# Patient Record
Sex: Female | Born: 1945
Health system: Southern US, Community
[De-identification: ages and names within clinical notes are randomized; demographics above are authoritative.]

## PROBLEM LIST (undated history)

## (undated) DIAGNOSIS — E7800 Pure hypercholesterolemia, unspecified: Secondary | ICD-10-CM

## (undated) DIAGNOSIS — N189 Chronic kidney disease, unspecified: Secondary | ICD-10-CM

## (undated) DIAGNOSIS — I10 Essential (primary) hypertension: Secondary | ICD-10-CM

## (undated) DIAGNOSIS — M1990 Unspecified osteoarthritis, unspecified site: Secondary | ICD-10-CM

## (undated) HISTORY — PX: VASCULAR SURGERY: SHX849

---

## 1997-09-12 ENCOUNTER — Emergency Department (HOSPITAL_COMMUNITY): Admission: EM | Admit: 1997-09-12 | Discharge: 1997-09-12 | Payer: Self-pay | Admitting: Emergency Medicine

## 1999-02-25 ENCOUNTER — Encounter: Payer: Self-pay | Admitting: Emergency Medicine

## 1999-02-25 ENCOUNTER — Encounter: Payer: Self-pay | Admitting: Internal Medicine

## 1999-02-25 ENCOUNTER — Inpatient Hospital Stay (HOSPITAL_COMMUNITY): Admission: EM | Admit: 1999-02-25 | Discharge: 1999-02-26 | Payer: Self-pay | Admitting: Emergency Medicine

## 1999-03-23 ENCOUNTER — Emergency Department (HOSPITAL_COMMUNITY): Admission: EM | Admit: 1999-03-23 | Discharge: 1999-03-23 | Payer: Self-pay | Admitting: Emergency Medicine

## 2000-02-03 ENCOUNTER — Encounter: Admission: RE | Admit: 2000-02-03 | Discharge: 2000-02-03 | Payer: Self-pay | Admitting: Internal Medicine

## 2000-02-17 ENCOUNTER — Ambulatory Visit (HOSPITAL_COMMUNITY): Admission: RE | Admit: 2000-02-17 | Discharge: 2000-02-17 | Payer: Self-pay | Admitting: Internal Medicine

## 2000-02-17 ENCOUNTER — Encounter: Payer: Self-pay | Admitting: Internal Medicine

## 2000-05-03 ENCOUNTER — Emergency Department (HOSPITAL_COMMUNITY): Admission: EM | Admit: 2000-05-03 | Discharge: 2000-05-03 | Payer: Self-pay | Admitting: Emergency Medicine

## 2001-08-02 ENCOUNTER — Encounter: Admission: RE | Admit: 2001-08-02 | Discharge: 2001-08-02 | Payer: Self-pay | Admitting: Internal Medicine

## 2001-08-24 ENCOUNTER — Encounter: Admission: RE | Admit: 2001-08-24 | Discharge: 2001-08-24 | Payer: Self-pay | Admitting: Internal Medicine

## 2001-08-24 ENCOUNTER — Encounter: Payer: Self-pay | Admitting: Internal Medicine

## 2001-10-04 ENCOUNTER — Encounter: Payer: Self-pay | Admitting: Internal Medicine

## 2001-10-04 ENCOUNTER — Ambulatory Visit (HOSPITAL_COMMUNITY): Admission: RE | Admit: 2001-10-04 | Discharge: 2001-10-04 | Payer: Self-pay | Admitting: Internal Medicine

## 2001-10-11 ENCOUNTER — Encounter: Admission: RE | Admit: 2001-10-11 | Discharge: 2002-01-09 | Payer: Self-pay | Admitting: Internal Medicine

## 2003-01-14 ENCOUNTER — Emergency Department (HOSPITAL_COMMUNITY): Admission: EM | Admit: 2003-01-14 | Discharge: 2003-01-14 | Payer: Self-pay | Admitting: Emergency Medicine

## 2003-01-16 ENCOUNTER — Ambulatory Visit (HOSPITAL_COMMUNITY): Admission: RE | Admit: 2003-01-16 | Discharge: 2003-01-16 | Payer: Self-pay | Admitting: Internal Medicine

## 2005-03-18 ENCOUNTER — Emergency Department (HOSPITAL_COMMUNITY): Admission: EM | Admit: 2005-03-18 | Discharge: 2005-03-18 | Payer: Self-pay | Admitting: Emergency Medicine

## 2005-04-22 ENCOUNTER — Ambulatory Visit (HOSPITAL_COMMUNITY): Admission: RE | Admit: 2005-04-22 | Discharge: 2005-04-22 | Payer: Self-pay | Admitting: Internal Medicine

## 2006-07-02 ENCOUNTER — Ambulatory Visit (HOSPITAL_COMMUNITY): Admission: RE | Admit: 2006-07-02 | Discharge: 2006-07-02 | Payer: Self-pay | Admitting: Gastroenterology

## 2006-07-02 ENCOUNTER — Encounter (INDEPENDENT_AMBULATORY_CARE_PROVIDER_SITE_OTHER): Payer: Self-pay | Admitting: Specialist

## 2007-01-19 ENCOUNTER — Ambulatory Visit (HOSPITAL_COMMUNITY): Admission: RE | Admit: 2007-01-19 | Discharge: 2007-01-19 | Payer: Self-pay | Admitting: Family Medicine

## 2007-12-05 ENCOUNTER — Emergency Department (HOSPITAL_COMMUNITY): Admission: EM | Admit: 2007-12-05 | Discharge: 2007-12-05 | Payer: Self-pay | Admitting: Emergency Medicine

## 2007-12-15 LAB — CONVERTED CEMR LAB: Pap Smear: NORMAL

## 2008-02-24 ENCOUNTER — Ambulatory Visit (HOSPITAL_COMMUNITY): Admission: RE | Admit: 2008-02-24 | Discharge: 2008-02-24 | Payer: Self-pay | Admitting: Internal Medicine

## 2008-11-16 ENCOUNTER — Emergency Department (HOSPITAL_COMMUNITY): Admission: EM | Admit: 2008-11-16 | Discharge: 2008-11-16 | Payer: Self-pay | Admitting: Emergency Medicine

## 2009-01-24 ENCOUNTER — Ambulatory Visit: Payer: Self-pay | Admitting: Family Medicine

## 2009-01-24 ENCOUNTER — Encounter: Payer: Self-pay | Admitting: Family Medicine

## 2009-01-24 DIAGNOSIS — I10 Essential (primary) hypertension: Secondary | ICD-10-CM | POA: Insufficient documentation

## 2009-01-24 DIAGNOSIS — E559 Vitamin D deficiency, unspecified: Secondary | ICD-10-CM | POA: Insufficient documentation

## 2009-01-24 DIAGNOSIS — E785 Hyperlipidemia, unspecified: Secondary | ICD-10-CM | POA: Insufficient documentation

## 2009-01-24 LAB — CONVERTED CEMR LAB
AST: 12 units/L (ref 0–37)
Chloride: 104 meq/L (ref 96–112)
Cholesterol: 236 mg/dL — ABNORMAL HIGH (ref 0–200)
Creatinine, Ser: 1.08 mg/dL (ref 0.40–1.20)
HDL: 49 mg/dL (ref >39)
LDL Cholesterol: 169 mg/dL — ABNORMAL HIGH (ref 0–99)
Total Bilirubin: 0.6 mg/dL (ref 0.3–1.2)
Total Protein: 7.5 g/dL (ref 6.0–8.3)
Triglycerides: 90 mg/dL (ref <150)

## 2009-01-25 ENCOUNTER — Encounter: Payer: Self-pay | Admitting: Family Medicine

## 2009-02-22 ENCOUNTER — Ambulatory Visit: Payer: Self-pay | Admitting: Family Medicine

## 2009-02-25 ENCOUNTER — Ambulatory Visit (HOSPITAL_COMMUNITY): Admission: RE | Admit: 2009-02-25 | Discharge: 2009-02-25 | Payer: Self-pay | Admitting: Family Medicine

## 2009-04-09 ENCOUNTER — Ambulatory Visit: Payer: Self-pay | Admitting: Family Medicine

## 2009-04-09 DIAGNOSIS — K089 Disorder of teeth and supporting structures, unspecified: Secondary | ICD-10-CM | POA: Insufficient documentation

## 2009-06-06 ENCOUNTER — Encounter: Payer: Self-pay | Admitting: Family Medicine

## 2009-10-22 ENCOUNTER — Ambulatory Visit: Payer: Self-pay | Admitting: Family Medicine

## 2009-10-22 DIAGNOSIS — B356 Tinea cruris: Secondary | ICD-10-CM | POA: Insufficient documentation

## 2009-10-22 DIAGNOSIS — M722 Plantar fascial fibromatosis: Secondary | ICD-10-CM | POA: Insufficient documentation

## 2009-10-31 ENCOUNTER — Telehealth (INDEPENDENT_AMBULATORY_CARE_PROVIDER_SITE_OTHER): Payer: Self-pay | Admitting: *Deleted

## 2009-11-04 ENCOUNTER — Ambulatory Visit: Payer: Self-pay | Admitting: Family Medicine

## 2009-11-04 ENCOUNTER — Encounter: Payer: Self-pay | Admitting: Sports Medicine

## 2009-11-04 DIAGNOSIS — K047 Periapical abscess without sinus: Secondary | ICD-10-CM | POA: Insufficient documentation

## 2009-12-02 ENCOUNTER — Encounter: Payer: Self-pay | Admitting: Family Medicine

## 2010-01-06 ENCOUNTER — Encounter: Payer: Self-pay | Admitting: Family Medicine

## 2010-01-06 ENCOUNTER — Ambulatory Visit: Payer: Self-pay | Admitting: Family Medicine

## 2010-01-06 DIAGNOSIS — R21 Rash and other nonspecific skin eruption: Secondary | ICD-10-CM | POA: Insufficient documentation

## 2010-01-09 ENCOUNTER — Ambulatory Visit: Payer: Self-pay | Admitting: Family Medicine

## 2010-01-14 ENCOUNTER — Encounter: Payer: Self-pay | Admitting: Family Medicine

## 2010-03-31 ENCOUNTER — Telehealth: Payer: Self-pay | Admitting: Family Medicine

## 2010-04-01 ENCOUNTER — Ambulatory Visit: Admission: RE | Admit: 2010-04-01 | Discharge: 2010-04-01 | Payer: Self-pay | Source: Home / Self Care

## 2010-04-05 ENCOUNTER — Encounter: Payer: Self-pay | Admitting: Internal Medicine

## 2010-04-15 NOTE — Progress Notes (Signed)
Summary: Ref Req  Phone Note Call from Patient Call back at Home Phone [phone]   Caller: Patient Summary of Call: Would like to be referred to the Dental Clinic has a toothache now. Initial call taken by: Clydell Hakim,  October 31, 2009 2:03 PM  Follow-up for Phone Call        called and left message with person answering phone to have patient call tomorrow morning.   will need to make appointment to access tooh pain . If she requires antibiotic and pain med can send in urgent referral to Dental Clinic.   otherwise will not be able to send referral in until Septemebr since we have met our qouta of 10 referrals for August. Follow-up by: Theresia Lo RN,  October 31, 2009 5:11 PM  Additional Follow-up for Phone Call Additional follow up Details #1::        spoke with patient and she states she has been having problem for 2 weeks. tooth is sensitative to hot and cold , aches and may be swollen. offered appointment today for evaluation but she cannot come. appointment scheduled 11/04/2009. Additional Follow-up by: Theresia Lo RN,  November 01, 2009 9:14 AM

## 2010-04-15 NOTE — Assessment & Plan Note (Signed)
Summary: f/u bp/tcb   Vital Signs:  Patient profile:   65 year old female Height:      67.75 inches Weight:      219.8 pounds BMI:     33.79 Temp:     98.7 degrees F oral Pulse rate:   97 / minute BP sitting:   144 / 81  (left arm) Cuff size:   regular  Vitals Entered By: Gladstone Pih (April 09, 2009 11:24 AM)  Nutrition Counseling: Patient's BMI is greater than 25 and therefore counseled on weight management options. CC: F/U BP Is Patient Diabetic? No Pain Assessment Patient in pain? no        Primary Care Provider:  Lequita Asal  MD  CC:  F/U BP.  History of Present Illness: 65 y/o female here to f/u HTN  HTN- reports BPs remain  mostly in 140s/80s despite addition of metoprolol. last visit HCTZ increased to 25 mg daily. denies chest pain, SOB, palpitations. no headaches. no lightheadedness. on losartan and metoprolol as well.  dental pain- awaiting extractions. no fever or chills or facial swelling.   Habits & Providers  Alcohol-Tobacco-Diet     Tobacco Status: never  Current Medications (verified): 1)  Losartan Potassium 50 Mg Tabs (Losartan Potassium) .... One Tab By Mouth Daily 2)  Hydrochlorothiazide 25 Mg Tabs (Hydrochlorothiazide) .... One Tab By Mouth Daily 3)  Lopressor 50 Mg Tabs (Metoprolol Tartrate) .... One Half Tab By Mouth Bid 4)  Metoprolol Tartrate 50 Mg Tabs (Metoprolol Tartrate) .... One Tab By Mouth Two Times A Day For Blood Pressure  Allergies (verified): No Known Drug Allergies  Physical Exam  General:  well appearing female, NAD. vitals reviewed.  Mouth:  fair dentition, MMM. some dental pain in R mandible.  Neck:  no JVD, bruits, or thyromegaly Lungs:  Normal respiratory effort, chest expands symmetrically. Lungs are clear to auscultation, no crackles or wheezes. Heart:  Normal rate and regular rhythm. S1 and S2 normal without gallop, murmur, click, rub or other extra sounds. Pulses:  2+ distal pulses Extremities:  no  peripheral edema of BLE   Impression & Recommendations:  Problem # 1:  ESSENTIAL HYPERTENSION, BENIGN (ICD-401.1) Assessment Unchanged  increase metoprolol to 50 mg twice a day. follow up via phone in 1 month.   Her updated medication list for this problem includes:    Losartan Potassium 50 Mg Tabs (Losartan potassium) ..... One tab by mouth daily    Hydrochlorothiazide 25 Mg Tabs (Hydrochlorothiazide) ..... One tab by mouth daily    Lopressor 50 Mg Tabs (Metoprolol tartrate) ..... One half tab by mouth bid    Metoprolol Tartrate 50 Mg Tabs (Metoprolol tartrate) ..... One tab by mouth two times a day for blood pressure  Orders: [facility]- Est Level  3 ([address])  Problem # 2:  DENTAL PAIN (ICD-525.9) Assessment: New  ultram 50 mg by mouth q6 hours for pain. follow up with dentist  Orders: [facility]- Est Level  3 ([address])  Patient Instructions: 1)  Increase metoprolol to ONE TABLET twice a day.  2)  Check your blood pressure 2-3 times a week for 3-4 weeks, then CALL HERE with results.  3)  Follow up with Dr. Lanier Prude in June or July Prescriptions: ULTRAM 50 MG TABS (TRAMADOL HCL) one tab by mouth q6 hours as needed for pain  #60 x 1   Entered and Authorized by:   Lequita Asal  MD   Signed by:   Lequita Asal  MD on 04/09/2009  Method used:   Faxed to ...       [facility] Department (retail)       [address] [place], [place]  [address]       Ph: [phone]       Fax: [phone]   RxID:   [id number] METOPROLOL TARTRATE 50 MG TABS (METOPROLOL TARTRATE) one tab by mouth two times a day for BLOOD PRESSURE  #60 x 4   Entered and Authorized by:   Lequita Asal  MD   Signed by:   Lequita Asal  MD on 04/09/2009   Method used:   Faxed to ...       [facility] Department (retail)       [address] [place], [place]  [address]       Ph: [phone]       Fax: [phone]   RxID:   [id number]    Prevention & Chronic  Care Immunizations   Influenza vaccine: Fluvax Non-MCR  (01/24/2009)    Tetanus booster: 12/15/2007: given    Pneumococcal vaccine: Not documented    H. zoster vaccine: Not documented   H. zoster vaccine deferral: Not available  (01/24/2009)  Colorectal Screening   Hemoccult: Not documented   Hemoccult action/deferral: Not indicated  (01/24/2009)    Colonoscopy: done, normal  (12/15/2007)  Other Screening   Pap smear: normal  (12/15/2007)    Mammogram: ASSESSMENT: Negative - BI-RADS 1^MM DIGITAL SCREENING  (02/25/2009)    DXA bone density scan: Not documented   Smoking status: never  (04/09/2009)  Lipids   Total Cholesterol: 236  (01/24/2009)   LDL: 169  (01/24/2009)   LDL Direct: Not documented   HDL: 49  (01/24/2009)   Triglycerides: 90  (01/24/2009)    SGOT (AST): 12  (01/24/2009)   SGPT (ALT): 8  (01/24/2009)   Alkaline phosphatase: 75  (01/24/2009)   Total bilirubin: 0.6  (01/24/2009)  Hypertension   Last Blood Pressure: 144 / 81  (04/09/2009)   Serum creatinine: 1.08  (01/24/2009)   Serum potassium 4.1  (01/24/2009)    Hypertension flowsheet reviewed?: Yes   Progress toward BP goal: Improved  Self-Management Support :   Personal Goals (by the next clinic visit) :      Personal blood pressure goal: 140/90  (01/24/2009)     Personal LDL goal: 130  (01/24/2009)    Patient will work on the following items until the next clinic visit to reach self-care goals:     Medications and monitoring: check my blood pressure  (04/09/2009)     Eating: eat foods that are low in salt, eat baked foods instead of fried foods  (02/22/2009)     Activity: take a 30 minute walk every day, take the stairs instead of the elevator, park at the far end of the parking lot  (04/09/2009)    Hypertension self-management support: BP self-monitoring log, Written self-care plan, Education handout  (04/09/2009)   Hypertension self-care plan printed.   Hypertension education handout  printed    Lipid self-management support: Written self-care plan  (02/22/2009)

## 2010-04-15 NOTE — Miscellaneous (Signed)
Summary: fax refill  Clinical Lists Changes Refilled via fax request.   Medications: Rx of BENICAR HCT 40-25 MG TABS (OLMESARTAN MEDOXOMIL-HCTZ) one by mouth daily;  #30 x 12;  Signed;  Entered by: Doralee Albino MD;  Authorized by: Doralee Albino MD;  Method used: Handwritten    Prescriptions: BENICAR HCT 40-25 MG TABS (OLMESARTAN MEDOXOMIL-HCTZ) one by mouth daily  #30 x 12   Entered and Authorized by:   Doralee Albino MD   Signed by:   Doralee Albino MD on 01/14/2010   Method used:   Handwritten   RxID:   [id number]

## 2010-04-15 NOTE — Letter (Signed)
Summary: Handout Printed  Printed Handout:  - Abscessed Tooth 

## 2010-04-15 NOTE — Miscellaneous (Signed)
  Clinical Lists Changes  Medications: Removed medication of ULTRAM 50 MG TABS (TRAMADOL HCL) one tab by mouth q6 hours as needed for pain

## 2010-04-15 NOTE — Assessment & Plan Note (Signed)
Summary: RECHECK PER WORK IN DR/KH   Vital Signs:  Patient profile:   65 year old female Height:      67.75 inches Weight:      221 pounds BMI:     33.97 Temp:     98.8 degrees F oral Pulse rate:   76 / minute BP sitting:   146 / 76  (left arm) Cuff size:   regular  Vitals Entered By: Jimmy Footman, CMA (January 09, 2010 9:52 AM) CC: recheck rash on both arms Is Patient Diabetic? No Pain Assessment Patient in pain? no        Primary Care Provider:  Barnabas Lister  MD  CC:  recheck rash on both arms.  History of Present Illness: 65 yo female here for recheck of rash.  Improved.  No new lesions.  +pruritis.  Has not gotten cream for itching.  Taking abx without problems.    Habits & Providers  Alcohol-Tobacco-Diet     Tobacco Status: never  Allergies: No Known Drug Allergies  Review of Systems       see HPI  Physical Exam  General:  Well-developed,well-nourished,in no acute distress; alert,appropriate and cooperative throughout examination.  Vitals noted. Skin:  3 hyperpigmented lesions on forehead.  Scattered crusted lesions on B arms, mostly extensor surfaces, and few lesions on B hands.  No excoriations or tracks.  No surrounding erythema.   Impression & Recommendations:  Problem # 1:  SKIN RASH (ICD-782.1)  Unclear etiology.  Healing well.  May use cream from Dr. Jeanice Lim for pruritis, and will continue Bactrim to finish course.  Follow up as needed.  Orders: [facility]- Est Level  2 ([address])  Problem # 2:  Preventive Health Care (ICD-V70.0) Flu shot today.  Complete Medication List: 1)  Benicar Hct 40-25 Mg Tabs (Olmesartan medoxomil-hctz) .... One by mouth daily 2)  Lopressor 50 Mg Tabs (Metoprolol tartrate) .... One half tab by mouth bid 3)  Vicodin 5-500 Mg Tabs (Hydrocodone-acetaminophen) .... One tab by mouth q6h as needed for severe pain. 4)  Crestor 10 Mg Tabs (Rosuvastatin calcium) .... Take 2 tabs by mouth at bedtime 5)  Bactrim Ds 800-160 Mg Tabs  (Sulfamethoxazole-trimethoprim) .... 2 tabs by mouth two times a day x 10 days 6)  Diphenhydramine Hcl 25 Mg Caps (Diphenhydramine hcl) .Marland Kitchen.. 1 by mouth q 6hrs as needed itch   Orders Added: 1)  [facility]- Est Level  2 [[address]]  Appended Document: RECHECK PER WORK IN DR/KH   Influenza Vaccine    Vaccine Type: Fluvax 3+    Site: right deltoid    Mfr: GlaxoSmithKline    Dose: 0.5 ml    Route: IM    Given by: Garen Grams LPN    Exp. Date: 09/10/2010    Lot #: [id number]    VIS given: 10/08/09 version given January 09, 2010.  Flu Vaccine Consent Questions    Do you have a history of severe allergic reactions to this vaccine? no    Any prior history of allergic reactions to egg and/or gelatin? no    Do you have a sensitivity to the preservative Thimersol? no    Do you have a past history of Guillan-Barre Syndrome? no    Do you currently have an acute febrile illness? no    Have you ever had a severe reaction to latex? no    Vaccine information given and explained to patient? yes    Are you currently pregnant? no

## 2010-04-15 NOTE — Assessment & Plan Note (Signed)
Summary: toothache/ls   Vital Signs:  Patient profile:   65 year old female Height:      67.75 inches Weight:      226 pounds BMI:     34.74 Temp:     98.7 degrees F oral Pulse rate:   78 / minute BP sitting:   151 / 76  (left arm) Cuff size:   large  Vitals Entered By: Tessie Fass CMA (November 04, 2009 9:34 AM) CC: toothache Is Patient Diabetic? No Pain Assessment Patient in pain? yes     Location: toothache Intensity: 8   Primary Care Provider:  Barnabas Lister  MD  CC:  toothache.  History of Present Illness: 65 yo female with long hx tooth problems, here with 1 month history of severe pain in L maxillary molar.  Needs to see a dentist but needs referral.  Has had several other teeth extracted in the past.  No fevers/chills, no HA, no visual changes, no neck pain, no jaw pain, no nasal drainage, no sinus pressure.  No eye pain.  Tol by mouth well.  Habits & Providers  Alcohol-Tobacco-Diet     Tobacco Status: never  Current Medications (verified): 1)  Benicar Hct 40-25 Mg Tabs (Olmesartan Medoxomil-Hctz) .... One Tab By Mouth Daily 2)  Hydrochlorothiazide 25 Mg Tabs (Hydrochlorothiazide) .... One Tab By Mouth Daily 3)  Lopressor 50 Mg Tabs (Metoprolol Tartrate) .... One Half Tab By Mouth Bid 4)  Crestor 20 Mg Tabs (Rosuvastatin Calcium) .... One Tab By Mouth Qhs 5)  Ibuprofen 800 Mg Tabs (Ibuprofen) .... Take One Tablet By Mouth Q12 Hr As Needed As Needed For Pain 6)  Amoxicillin 500 Mg Caps (Amoxicillin) .... One Tab By Mouth Three Times A Day X 10d 7)  Vicodin 5-500 Mg Tabs (Hydrocodone-Acetaminophen) .... One Tab By Mouth Q6h As Needed For Severe Pain.  Allergies (verified): No Known Drug Allergies  Review of Systems       See HPI  Physical Exam  General:  Well-developed,well-nourished,in no acute distress; alert,appropriate and cooperative throughout examination Head:  Normocephalic and atraumatic without obvious abnormalities.  Eyes:  No corneal or  conjunctival inflammation noted. EOMI. Perrla. Ears:  External ear exam shows no significant lesions or deformities.  Otoscopic examination reveals clear canals, tympanic membranes are intact bilaterally without bulging, retraction, inflammation or discharge. Hearing is grossly normal bilaterally. Nose:  External nasal examination shows no deformity or inflammation. Nasal mucosa are pink and moist without lesions or exudates. Mouth:  L maxillary molar necrotic, nearly completely destroyed, multiple other teeth in various stages of disrepair.  No over purulence but very TTP.   Neck:  No deformities, masses, or tenderness noted. Cervical Nodes:  No lymphadenopathy noted   Impression & Recommendations:  Problem # 1:  ABSCESS, TOOTH (ICD-522.5) Assessment New Amox 500 three times a day x 10d. Vicodin as needed. Dental referral for extraction. Handouts given.  Orders: [facility]- Est Level  3 ([address]) Dental Referral (Dentist)  Complete Medication List: 1)  Benicar Hct 40-25 Mg Tabs (Olmesartan medoxomil-hctz) .... One tab by mouth daily 2)  Hydrochlorothiazide 25 Mg Tabs (Hydrochlorothiazide) .... One tab by mouth daily 3)  Lopressor 50 Mg Tabs (Metoprolol tartrate) .... One half tab by mouth bid 4)  Crestor 20 Mg Tabs (Rosuvastatin calcium) .... One tab by mouth qhs 5)  Ibuprofen 800 Mg Tabs (ibuprofen)  .... Take one tablet by mouth q12 hr as needed as needed for pain 6)  Amoxicillin 500 Mg Caps (Amoxicillin) .Marland KitchenMarland KitchenMarland Kitchen  One tab by mouth three times a day x 10d 7)  Vicodin 5-500 Mg Tabs (Hydrocodone-acetaminophen) .... One tab by mouth q6h as needed for severe pain. Prescriptions: VICODIN 5-500 MG TABS (HYDROCODONE-ACETAMINOPHEN) One tab by mouth q6h as needed for severe pain.  #30 x 0   Entered and Authorized by:   Rodney Langton MD   Signed by:   Rodney Langton MD on 11/04/2009   Method used:   Print then Give to Patient   RxID:   [id number] AMOXICILLIN 500 MG CAPS (AMOXICILLIN)  One tab by mouth three times a day x 10d  #30 x 0   Entered and Authorized by:   Rodney Langton MD   Signed by:   Rodney Langton MD on 11/04/2009   Method used:   Print then Give to Patient   RxID:   [id number]

## 2010-04-15 NOTE — Assessment & Plan Note (Signed)
Summary: rasgh all over,df   Vital Signs:  Patient profile:   65 year old female Height:      67.75 inches Weight:      221 pounds BMI:     33.97 Temp:     98.6 degrees F oral Pulse rate:   74 / minute BP sitting:   147 / 72  (left arm) Cuff size:   large  Vitals Entered By: Tessie Fass CMA (January 06, 2010 9:59 AM) CC: rash bilateral arm and face Pain Assessment Patient in pain? no        Primary Care Provider:  Barnabas Lister  MD  CC:  rash bilateral arm and face.  History of Present Illness:  Micah Flesher to the dentist last Wed, Had tooth pulled, numbing medicine used. Friday woke with rash on arms and face, large red bumps, some were draining, no fever, no vomiting no diarrhea, +pruritis no new medications, no new soaps lotion, detergents Tried a anti-itch cream over the counter Last antibiotics Amox x 10 days in Aug, with Vicodin   Needs something for pain for tooth  Current Medications (verified): 1)  Benicar Hct 40-25 Mg Tabs (Olmesartan Medoxomil-Hctz) .... One By Mouth Daily 2)  Lopressor 50 Mg Tabs (Metoprolol Tartrate) .... One Half Tab By Mouth Bid 3)  Vicodin 5-500 Mg Tabs (Hydrocodone-Acetaminophen) .... One Tab By Mouth Q6h As Needed For Severe Pain. 4)  Crestor 10 Mg Tabs (Rosuvastatin Calcium) .... Take 2 Tabs By Mouth At Bedtime 5)  Bactrim Ds 800-160 Mg Tabs (Sulfamethoxazole-Trimethoprim) .... 2 Tabs By Mouth Two Times A Day X 10 Days 6)  Diphenhydramine Hcl 25 Mg Caps (Diphenhydramine Hcl) .Marland Kitchen.. 1 By Mouth Q 6hrs As Needed Itch  Allergies (verified): No Known Drug Allergies  Past History:  Past Medical History: Last updated: 01/24/2009 Hyperlipidemia Hypertension Vitamin D Deficiency  Physical Exam  General:  Well-developed,well-nourished,in no acute distress; alert,appropriate and cooperative throughout examination Vital signs noted  Mouth:   multiple other teeth in various stages of disrepair.  swelling on lower gumline, brige in  place multiple cavities, no ulcerations noted   Neck:  No LAD Skin:  Mutipel vesicular lesion on forhead and bilateral arms, erythematous with clear drainage with palpation, some erythematous maculopapular lesions on fingers with excoriations, no pus expressed  Palms and soles spared, no lesions on back, chest, LE   Impression & Recommendations:  Problem # 1:  SKIN RASH (ICD-782.1) Assessment New  Unknown cause does not appear to be classisc allergic response to medications or contact dermaitis, some lesiosn similar to varicalla but not in classic presensation. With open drainaing lesions will cover with Bactrim for MRSA, given red flags recheck this week  Orders: [facility]- Est  Level 4 ([address])  Problem # 2:  ABSCESS, TOOTH (ICD-522.5) Assessment: Improved  s/p tooth removal, given course of vicodin as no meds prescribed by dentist  Orders: [facility]- Est  Level 4 ([address])  Complete Medication List: 1)  Benicar Hct 40-25 Mg Tabs (Olmesartan medoxomil-hctz) .... One by mouth daily 2)  Lopressor 50 Mg Tabs (Metoprolol tartrate) .... One half tab by mouth bid 3)  Vicodin 5-500 Mg Tabs (Hydrocodone-acetaminophen) .... One tab by mouth q6h as needed for severe pain. 4)  Crestor 10 Mg Tabs (Rosuvastatin calcium) .... Take 2 tabs by mouth at bedtime 5)  Bactrim Ds 800-160 Mg Tabs (Sulfamethoxazole-trimethoprim) .... 2 tabs by mouth two times a day x 10 days 6)  Diphenhydramine Hcl 25 Mg Caps (Diphenhydramine hcl) .Marland KitchenMarland KitchenMarland Kitchen  1 by mouth q 6hrs as needed itch  Patient Instructions: 1)  Take the antibiotics twice a day 2)  Use the vicodin as needed for pain 3)  Return for a visit on Thursday for a recheck by the work-in doctor Prescriptions: DIPHENHYDRAMINE HCL 25 MG CAPS (DIPHENHYDRAMINE HCL) 1 by mouth q 6hrs as needed itch  #60 x 1   Entered and Authorized by:   Milinda Antis MD   Signed by:   Milinda Antis MD on 01/06/2010   Method used:   Print then Give to Patient   RxID:    [id number] VICODIN 5-500 MG TABS (HYDROCODONE-ACETAMINOPHEN) One tab by mouth q6h as needed for severe pain.  #40 x 0   Entered and Authorized by:   Milinda Antis MD   Signed by:   Milinda Antis MD on 01/06/2010   Method used:   Handwritten   RxID:   [id number] BACTRIM DS 800-160 MG TABS (SULFAMETHOXAZOLE-TRIMETHOPRIM) 2 tabs by mouth two times a day x 10 days  #40 x 0   Entered and Authorized by:   Milinda Antis MD   Signed by:   Milinda Antis MD on 01/06/2010   Method used:   Print then Give to Patient   RxID:   [id number]    Orders Added: 1)  [facility]- Est  Level 4 [[address]]

## 2010-04-15 NOTE — Miscellaneous (Signed)
Summary: Change ARB  Clinical Lists Changes Based on drug availability through [facility], changed ARB. Medications: Changed medication from BENICAR HCT 40-25 MG TABS (OLMESARTAN MEDOXOMIL-HCTZ) one tab by mouth daily to AVAPRO 150 MG TABS (IRBESARTAN) one by mouth daily - Signed Rx of AVAPRO 150 MG TABS (IRBESARTAN) one by mouth daily;  #30 x 12;  Signed;  Entered by: Doralee Albino MD;  Authorized by: Doralee Albino MD;  Method used: Printed then faxed to [facility], [address] [place], [place], [place]  [address], Ph: [phone], Fax: [phone]    Prescriptions: AVAPRO 150 MG TABS (IRBESARTAN) one by mouth daily  #30 x 12   Entered and Authorized by:   Doralee Albino MD   Signed by:   Doralee Albino MD on 12/02/2009   Method used:   Printed then faxed to ...       [facility] Department (retail)       [address] [place], [place]  [address]       Ph: [phone]       Fax: [phone]   RxID:   [id number]

## 2010-04-15 NOTE — Assessment & Plan Note (Signed)
Summary: f/up,tcb   Vital Signs:  Patient profile:   65 year old female Weight:      232.2 pounds Temp:     99 degrees F oral Pulse rate:   71 / minute Pulse rhythm:   regular BP sitting:   133 / 80  (left arm) Cuff size:   large  Vitals Entered By: Loralee Pacas CMA (October 22, 2009 2:53 PM) CC: follow-up visit Is Patient Diabetic? No Comments right heel of foot hurting    Primary Provider:  Barnabas Lister  MD  CC:  follow-up visit.  History of Present Illness: CC: 1) Right heel pain, 2) Rash Right groin, 3) Chronic dental pain  HPI:  This is a 65 yo female with a PMH HTN, HLD who p/w Right heel pain and a rash on her groin.  The heel pain began one month ago.  This has never occurred before.  Pain is worse in the morning, most tender when she takes her first steps.  Dull pain that gets better with walking.  Denies any injury or trauma to foot.  Non-radiating pain.   Rash on groin has been present for years, but gets worse in the summer time when she gets over-heated and sweaty.  Patient wants to know what she can put on it to relieve the itching.  Uses Baby Oil but the sypmtoms come back.   Finally, patient c/o chronic dental pain.  Trying to get an appt with her dentist for multiple tooth extractions.  Wants something for pain until she can see her dentist.   ROS:  Pt denies any CP, SOB, N/V.  Denies fever, chills, HA.  Denies any numbness or tingling or decreased ROM in all extremities.  Does c/o right heel pain.  Problems Prior to Update: 1)  Tinea Cruris  (ICD-110.3) 2)  Dental Pain  (ICD-525.9) 3)  Vitamin D Deficiency  (ICD-268.9) 4)  Hyperlipidemia  (ICD-272.4) 5)  Essential Hypertension, Benign  (ICD-401.1)  Current Medications (verified): 1)  Benicar Hct 40-25 Mg Tabs (Olmesartan Medoxomil-Hctz) .... One Tab By Mouth Daily 2)  Hydrochlorothiazide 25 Mg Tabs (Hydrochlorothiazide) .... One Tab By Mouth Daily 3)  Lopressor 50 Mg Tabs (Metoprolol Tartrate) .... One  Half Tab By Mouth Bid 4)  Crestor 20 Mg Tabs (Rosuvastatin Calcium) .... One Tab By Mouth Qhs 5)  Ibuprofen 800 Mg Tabs (Ibuprofen) .... Take One Tablet By Mouth Q12 Hr As Needed As Needed For Pain  Allergies (verified): No Known Drug Allergies  Past History:  Past Medical History: Last updated: 01/24/2009 Hyperlipidemia Hypertension Vitamin D Deficiency  Past Surgical History: Last updated: 01/24/2009 BTL  Family History: Last updated: 10/22/2009 father- heart attack at 42, HLD Uncle- colon cancer DM - mother, grandparents HTN - all family members  Social History: Last updated: 01/24/2009 widowed. daughter, Misty Stanley, lives with her. enjoys going for walks and church. watches her great-granddaughter some times. denies EtOH, drugs, tobacco  Family History: father- heart attack at 20, HLD Uncle- colon cancer DM - mother, grandparents HTN - all family members  Review of Systems       per HPI  Physical Exam  General:  alert and cooperative to examination.   Mouth:  poor dentition and teeth missing.   Lungs:   Lungs are clear to auscultation, no crackles or wheezes. Heart:  Normal rate and regular rhythm. S1 and S2 normal without gallop, murmur, click, rub or other extra sounds. Msk:  Right and Left LE: normal ROM,  no joint tenderness, no joint swelling, no joint warmth, no redness over joints, no joint deformities, no joint instability, and no muscle atrophy.   Pulses:  R dorsalis pedis normal and L dorsalis pedis normal.     Impression & Recommendations:  Problem # 1:  FASCIITIS, PLANTAR (ICD-728.71) Assessment New Patient's heel pain most likely due to plantar fasciitis.  Pain is worse in the am and gets better with walking throughout the day.  Patient admits to wearing slippers around the house and sandals with no support.  Patient is overweight.  Advised patient to rest and to use ice on her heel three times a day for the next 2-4 weeks.  If pain persists,  recommended that she take one tablet of Ibuprofen 800 by mouth as needed for pain.  Gave patient handout on plantar fasciitis that includes descriptions of disease, treatment, and home exercises that can be used to alleviate the pain.  Told patient the pain usually subsides in 4 weeks, but to return to clinic if heel becomes red, swollen, or she can no longer bear weight on her feet.  Also, advised patient to wear heel inserts or sneakers for better support.  Problem # 2:  TINEA CRURIS (ICD-110.3) Assessment: Improved Patient c/o rash on her groin that has been present for years, but gets worse in the heat.  KOH prep was Positive.  Discussed with patient that she has a fungal infection that can be treated with OTC miconazole topical cream.  Advised to apply ointment to affected area twice a day.  Patient will f/u with me in 6 weeks for routine follow up and f/u on fungal infection.  Orders: [facility] (62831)  Problem # 3:  DENTAL PAIN (ICD-525.9) Assessment: Unchanged Her dental pain is chronic.  She needs to see her dentist to have multiple teeth extracted, but claims that she cannot make an appointment until September.  C/o Left sided pain.  No swelling, no draining, no erythema.  Advised patient to take OTC NSAIDS for pain relief.  If pain persists, she can take one tablet of Ibuprofen 800mg  by mouth as needed for pain.  Advised to please f/u with dentist for further evaluation.  Problem # 4:  ESSENTIAL HYPERTENSION, BENIGN (ICD-401.1) Assessment: Unchanged Patient BP was 133/80 today, which has improved from last visit 144/98.  BP at goal.  Did not need any refills at this time. Will f/u with patient and review preventative care measures at next visit.  The following medications were removed from the medication list:    Toprol Xl 100 Mg Xr24h-tab (Metoprolol succinate) ..... One tab by mouth daily Her updated medication list for this problem includes:    Benicar Hct 40-25 Mg Tabs (Olmesartan  medoxomil-hctz) ..... One tab by mouth daily    Hydrochlorothiazide 25 Mg Tabs (Hydrochlorothiazide) ..... One tab by mouth daily    Lopressor 50 Mg Tabs (Metoprolol tartrate) ..... One half tab by mouth bid  Complete Medication List: 1)  Benicar Hct 40-25 Mg Tabs (Olmesartan medoxomil-hctz) .... One tab by mouth daily 2)  Hydrochlorothiazide 25 Mg Tabs (Hydrochlorothiazide) .... One tab by mouth daily 3)  Lopressor 50 Mg Tabs (Metoprolol tartrate) .... One half tab by mouth bid 4)  Crestor 20 Mg Tabs (Rosuvastatin calcium) .... One tab by mouth qhs 5)  Ibuprofen 800 Mg Tabs (ibuprofen)  .... Take one tablet by mouth q12 hr as needed as needed for pain  Patient Instructions: 1)  For heel pain, try to rest and use  ice three times a day to relieve the pain.   2)  If heel and dental pain persist, take Ibuprofen 600mg  every 12 hr for pain. 3)  Please return to clinic if heel pain becomes worse, red, swollen, or you can no longer bear weight. 4)  For rash, please ask your local pharmacy for Miconazole topical cream.  Apply ointment to affected area twice a day. 5)  Schedule f/u appt with Dr. Tye Savoy in 6 months for routine check up. Prescriptions: IBUPROFEN 600 MG TABS (IBUPROFEN) Take one tablet by mouth q12 hr as needed as needed for pain  #60 x 0   Entered and Authorized by:   Ivy de Lawson Radar  MD   Signed by:   Barnabas Lister  MD on 10/22/2009   Method used:   Faxed to ...       [facility] (retail)       [address] [place], [place]  [address]       Ph: [phone]       Fax: [phone]   RxID:   [id number]   Laboratory Results  Date/Time Received: October 22, 2009 3:40 PM  Date/Time Reported: October 22, 2009 4:29 PM   Other Tests  Skin KOH: Positive Comments: ...............test performed by......Marland KitchenBonnie A. [place], MLS (ASCP)cm     Appended Document: f/up,tcb    Clinical Lists Changes  Orders: Added new Test order of [facility]- New  Level 3 (16073) - Signed      Appended Document: f/up,tcb    Clinical Lists Changes  Orders: Added new Test order of [facility]- Est Level  3 (71062) - Signed

## 2010-04-15 NOTE — Miscellaneous (Signed)
Summary: formulary change  Clinical Lists Changes MAP received meds so change back to benicar HCT Medications: Removed medication of HYDROCHLOROTHIAZIDE 25 MG TABS (HYDROCHLOROTHIAZIDE) one tab by mouth daily Changed medication from AVAPRO 150 MG TABS (IRBESARTAN) one by mouth daily to BENICAR HCT 40-25 MG TABS (OLMESARTAN MEDOXOMIL-HCTZ) one by mouth daily

## 2010-04-17 NOTE — Assessment & Plan Note (Signed)
Summary: dental pain-see notes/[place]   Vital Signs:  Patient profile:   65 year old female Height:      67.75 inches Weight:      225 pounds BMI:     34.59 Temp:     98.6 degrees F oral Pulse rate:   83 / minute BP sitting:   113 / 76  (left arm) Cuff size:   large  Vitals Entered By: Tessie Fass CMA (April 01, 2010 8:44 AM) CC: dental pain x 3 days Pain Assessment Patient in pain? yes     Location: dental Intensity: 8   Primary Care Provider:  Barnabas Lister  MD  CC:  dental pain x 3 days.  History of Present Illness: 1) Dental pain: x 3 days. Longstanding hsitory of dental issues. Pain at upper right lateral incisor. HAs already been referred to dentist for extractions - has had multiple extractions aleardy.   ROS: Denies fevers/chills, no HA, no visual changes, no neck pain,  no nasal drainage, no sinus pressure.  Able to eat and drink.   Allergies: No Known Drug Allergies  Physical Exam  General:  obese, NAD  Nose:  External nasal examination shows no deformity or inflammation. Nasal mucosa are pink and moist without lesions or exudates. Mouth:  multiple other teeth in various stages of disrepair.  mild swelling and erythema at upper gumline, multiple cavities, no abscess noted     Impression & Recommendations:  Problem # 1:  DENTAL PAIN (ICD-525.9) Assessment Deteriorated  Will treat for tooth abscess (though no visible abscess today). Advised follow up ASAP with dentist - she states she will schedule appointment today.   Orders: [facility]- Est Level  3 (11914)  Complete Medication List: 1)  Benicar Hct 40-25 Mg Tabs (Olmesartan medoxomil-hctz) .... One by mouth daily 2)  Lopressor 50 Mg Tabs (Metoprolol tartrate) .... One half tab by mouth bid 3)  Vicodin 5-500 Mg Tabs (Hydrocodone-acetaminophen) .... One tab by mouth q6h as needed for severe pain. 4)  Crestor 10 Mg Tabs (Rosuvastatin calcium) .... Take 2 tabs by mouth at bedtime 5)   Diphenhydramine Hcl 25 Mg Caps (Diphenhydramine hcl) .Marland Kitchen.. 1 by mouth q 6hrs as needed itch 6)  Penicillin V Potassium 500 Mg Tabs (Penicillin v potassium) .... One tab by mouth three times a day x 7 days  Patient Instructions: 1)  Make your appointment today with your dentist  2)  Follow up as needed  Prescriptions: VICODIN 5-500 MG TABS (HYDROCODONE-ACETAMINOPHEN) One tab by mouth q6h as needed for severe pain.  #20 x 0   Entered and Authorized by:   Bobby Rumpf  MD   Signed by:   Bobby Rumpf  MD on 04/01/2010   Method used:   Print then Give to Patient   RxID:   [id number] PENICILLIN V POTASSIUM 500 MG TABS (PENICILLIN V POTASSIUM) one tab by mouth three times a day x 7 days  #21 x 0   Entered and Authorized by:   Bobby Rumpf  MD   Signed by:   Bobby Rumpf  MD on 04/01/2010   Method used:   Print then Give to Patient   RxID:   [id number] VICODIN 5-500 MG TABS (HYDROCODONE-ACETAMINOPHEN) One tab by mouth q6h as needed for severe pain.  #40 x 0   Entered and Authorized by:   Bobby Rumpf  MD   Signed by:   Bobby Rumpf  MD on 04/01/2010   Method used:  Print then Give to Patient   RxID:   [id number] PENICILLIN V POTASSIUM 500 MG TABS (PENICILLIN V POTASSIUM) one tab by mouth three times a day x 7 days  #21 x 0   Entered and Authorized by:   Bobby Rumpf  MD   Signed by:   Bobby Rumpf  MD on 04/01/2010   Method used:   Print then Give to Patient   RxID:   [id number]    Orders Added: 1)  [facility]- Est Level  3 [16073]

## 2010-04-17 NOTE — Progress Notes (Signed)
Summary: phn msg  Phone Note Call from Patient Call back at Home Phone [phone]   Caller: Patient Summary of Call: is having teeth pain and wants something called in for this  Initial call taken by: De Nurse,  March 31, 2010 10:35 AM  Follow-up for Phone Call        pain in front upper teeth.  dentist told her to take tylenol. states thi sdoes not help. told her she will need to be seen if she wants something else. appt made for tomorrow am. states she cannot come in today.  relates that she is in the process of getting all of her teeth pulled. Follow-up by: Golden Circle RN,  March 31, 2010 10:47 AM

## 2010-06-13 ENCOUNTER — Emergency Department (HOSPITAL_COMMUNITY)
Admission: EM | Admit: 2010-06-13 | Discharge: 2010-06-13 | Disposition: A | Discharge status: Home / Self Care | Payer: Self-pay | Attending: Emergency Medicine | Admitting: Emergency Medicine

## 2010-06-13 DIAGNOSIS — R599 Enlarged lymph nodes, unspecified: Secondary | ICD-10-CM | POA: Insufficient documentation

## 2010-06-13 DIAGNOSIS — K047 Periapical abscess without sinus: Secondary | ICD-10-CM | POA: Insufficient documentation

## 2010-06-13 DIAGNOSIS — K029 Dental caries, unspecified: Secondary | ICD-10-CM | POA: Insufficient documentation

## 2010-06-13 DIAGNOSIS — K089 Disorder of teeth and supporting structures, unspecified: Secondary | ICD-10-CM | POA: Insufficient documentation

## 2010-06-13 DIAGNOSIS — E7800 Pure hypercholesterolemia, unspecified: Secondary | ICD-10-CM | POA: Insufficient documentation

## 2010-06-13 DIAGNOSIS — I10 Essential (primary) hypertension: Secondary | ICD-10-CM | POA: Insufficient documentation

## 2010-08-01 NOTE — Op Note (Signed)
Tara Guerra, PAGET                ACCOUNT NO.:  [id number]   MEDICAL RECORD NO.:  [id number]          PATIENT TYPE:  AMB   LOCATION:  ENDO                         FACILITY:  [facility]   PHYSICIAN:  Anselmo Rod, M.D.  DATE OF BIRTH:  1945-04-27   DATE OF PROCEDURE:  07/02/2006  DATE OF DISCHARGE:                               OPERATIVE REPORT   PROCEDURE PERFORMED:  Colonoscopy, with snare polypectomy x2, and cold  biopsies x1.   ENDOSCOPIST:  Anselmo Rod, M.D.   INSTRUMENT USED:  Pentax video colonoscope.   INDICATIONS FOR PROCEDURE:  A 65 year old African American female  undergoing a screening colonoscopy to rule out colonic polyps, masses,  etc.  The patient tells me today that she has found out her paternal  grandfather and her paternal uncle had colon cancer.  The exact nature  of the diagnosis is not known.   PREPROCEDURE PREPARATION:  Informed consent was procured from the  patient.  The patient was fasted for four hours prior to the procedure  and prepped with 20 OsmoPrep pills the night prior to the procedure and  12 OsmoPrep pills the morning of the procedure. Risks and benefits of  the procedure, including a 10% miss rate of cancer and polyps were  discussed with the patient as well.   PREPROCEDURE PHYSICAL EXAMINATION:  VITAL SIGNS:  The patient had stable  vital signs.  NECK:  Supple.  CHEST:  Clear to auscultation.  CARDIOVASCULAR:  S1, S2 regular.  ABDOMEN:  Soft, with normal bowel sounds.   DESCRIPTION OF THE PROCEDURE:  The patient was placed in the left  lateral decubitus position and sedated with 100 mcg of fentanyl and 10  mg of Versed given intravenously in slow incremental doses.  Once the  patient was adequately sedated and maintained on low-flow oxygen and  continuous cardiac monitoring, the Pentax video colonoscope was advanced  from the rectum to the cecum.  Two small sessile polyps was snared from  the right colon and from the hepatic  flexure, respectively.  These were  removed by a hot snare.  Another small sessile polyp was removed by cold  biopsy from the distal right colon.  Small internal hemorrhoids were  seen.  There was no evidence of diverticulosis.  The patient tolerated  the procedure well, without complication.  There was some residual stool  in the right colon.  Multiple washes were done.  Visualization was  adequate. The terminal ileum appeared healthy and without lesion.   IMPRESSION:  1. Small nonbleeding internal hemorrhoids.  2. Small sessile polyp biopsied from the distal right colon.  One      polyp snared from hepatic flexure, and one from the right colon      (hot snare x2).  3. Normal terminal ileum.  4. Some residual stool in the right colon.  Multiple washes done.  5. No evidence of diverticulosis.   RECOMMENDATIONS:  1. Await pathology results.  2. Avoid all nonsteroidals, including aspirin, for the next 2 weeks.  3. Repeat colonoscopy depending on pathology results.  4. Outpatient followup as  need arises in the future.      Anselmo Rod, M.D.  Electronically Signed     JNM/MEDQ  D:  07/02/2006  T:  07/02/2006  Job:  [id number]   cc:   Candyce Churn. Allyne Gee, M.D.

## 2010-12-15 LAB — POCT I-STAT, CHEM 8
BUN: 24 — ABNORMAL HIGH
Calcium, Ion: 1.2
Glucose, Bld: 97
Sodium: 137
TCO2: 29

## 2010-12-15 LAB — POCT CARDIAC MARKERS
Myoglobin, poc: 75.4
Troponin i, poc: <0.05

## 2011-02-09 ENCOUNTER — Other Ambulatory Visit (HOSPITAL_COMMUNITY): Payer: Self-pay | Admitting: Internal Medicine

## 2011-02-09 DIAGNOSIS — Z1231 Encounter for screening mammogram for malignant neoplasm of breast: Secondary | ICD-10-CM

## 2011-02-11 ENCOUNTER — Ambulatory Visit (HOSPITAL_COMMUNITY)
Admission: RE | Admit: 2011-02-11 | Discharge: 2011-02-11 | Disposition: A | Discharge status: Home / Self Care | Payer: Medicare Other | Source: Ambulatory Visit | Attending: Internal Medicine | Admitting: Internal Medicine

## 2011-02-11 DIAGNOSIS — Z1231 Encounter for screening mammogram for malignant neoplasm of breast: Secondary | ICD-10-CM | POA: Insufficient documentation

## 2011-06-08 ENCOUNTER — Other Ambulatory Visit: Payer: Self-pay | Admitting: Family Medicine

## 2013-01-03 ENCOUNTER — Other Ambulatory Visit (HOSPITAL_COMMUNITY): Payer: Self-pay | Admitting: Internal Medicine

## 2013-01-03 DIAGNOSIS — Z1231 Encounter for screening mammogram for malignant neoplasm of breast: Secondary | ICD-10-CM

## 2013-01-12 ENCOUNTER — Ambulatory Visit (HOSPITAL_COMMUNITY)
Admission: RE | Admit: 2013-01-12 | Discharge: 2013-01-12 | Disposition: A | Discharge status: Home / Self Care | Payer: Medicare Other | Source: Ambulatory Visit | Attending: Internal Medicine | Admitting: Internal Medicine

## 2013-01-12 DIAGNOSIS — Z1231 Encounter for screening mammogram for malignant neoplasm of breast: Secondary | ICD-10-CM | POA: Insufficient documentation

## 2013-02-25 ENCOUNTER — Emergency Department (HOSPITAL_COMMUNITY)
Admission: EM | Admit: 2013-02-25 | Discharge: 2013-02-25 | Disposition: A | Discharge status: Home / Self Care | Payer: Medicare Other | Source: Home / Self Care

## 2013-02-25 ENCOUNTER — Encounter (HOSPITAL_COMMUNITY): Payer: Self-pay | Admitting: Emergency Medicine

## 2013-02-25 DIAGNOSIS — L03011 Cellulitis of right finger: Secondary | ICD-10-CM

## 2013-02-25 DIAGNOSIS — L02519 Cutaneous abscess of unspecified hand: Secondary | ICD-10-CM

## 2013-02-25 DIAGNOSIS — L089 Local infection of the skin and subcutaneous tissue, unspecified: Secondary | ICD-10-CM

## 2013-02-25 HISTORY — DX: Essential (primary) hypertension: I10

## 2013-02-25 MED ORDER — HYDROCODONE-ACETAMINOPHEN 5-325 MG PO TABS: 1.0000 | ORAL_TABLET | ORAL | Status: DC | PRN

## 2013-02-25 MED ORDER — CEPHALEXIN 500 MG PO CAPS: 500.0000 mg | ORAL_CAPSULE | Freq: Four times a day (QID) | ORAL | Status: DC

## 2013-02-25 NOTE — ED Notes (Signed)
Dressing applied. 

## 2013-02-25 NOTE — ED Provider Notes (Signed)
CSN: [id number]     Arrival date & time 02/25/13  1051 History   First MD Initiated Contact with Patient 02/25/13 1301     Chief Complaint  Patient presents with  . Hand Pain   (Consider location/radiation/quality/duration/timing/severity/associated sxs/prior Treatment) HPI Comments: Developed an area of soreness to the proximal phalynx 8 d ago associated with tenderness. Progessed to erythema, mild swelling and a pustule on dorsal aspect.   Past Medical History  Diagnosis Date  . Hypertension    History reviewed. No pertinent past surgical history. History reviewed. No pertinent family history. History  Substance Use Topics  . Smoking status: Never Smoker   . Smokeless tobacco: Not on file  . Alcohol Use: No   OB History   Grav Para Term Preterm Abortions TAB SAB Ect Mult Living                 Review of Systems  Constitutional: Negative.   Skin:       As per HPI  All other systems reviewed and are negative.    Allergies  Review of patient's allergies indicates no known allergies.  Home Medications   Current Outpatient Rx  Name  Route  Sig  Dispense  Refill  . cephALEXin (KEFLEX) 500 MG capsule   Oral   Take 1 capsule (500 mg total) by mouth 4 (four) times daily.   28 capsule   0   . diphenhydrAMINE (BENADRYL) 25 MG tablet   Oral   Take 25 mg by mouth every 6 (six) hours as needed. For itch          . HYDROcodone-acetaminophen (NORCO/VICODIN) 5-325 MG per tablet   Oral   Take 1 tablet by mouth every 4 (four) hours as needed.   15 tablet   0   . metoprolol (LOPRESSOR) 50 MG tablet   Oral   Take 25 mg by mouth 2 (two) times daily.           Marland Kitchen olmesartan-hydrochlorothiazide (BENICAR HCT) 40-25 MG per tablet   Oral   Take 1 tablet by mouth daily.           . penicillin v potassium (VEETID) 500 MG tablet   Oral   Take 500 mg by mouth 3 (three) times daily. For 7 days          . rosuvastatin (CRESTOR) 10 MG tablet   Oral   Take 20 mg by  mouth at bedtime.            BP 166/80  Pulse 73  Temp(Src) 98.7 F (37.1 C) (Oral)  Resp 20  SpO2 100% Physical Exam  Nursing note and vitals reviewed. Constitutional: She is oriented to person, place, and time. She appears well-developed and well-nourished. No distress.  Neurological: She is alert and oriented to person, place, and time. She exhibits normal muscle tone.  Skin: Skin is warm and dry.  Mild swelling to proximal phalynx of R index finger with redness and a pustule.  Psychiatric: She has a normal mood and affect.    ED Course  INCISION AND DRAINAGE Date/Time: 02/25/2013 1:20 PM Performed by: Phineas Real, Londan Coplen Authorized by: Bradd Canary D Consent: Verbal consent obtained. Risks and benefits: risks, benefits and alternatives were discussed Consent given by: patient Patient understanding: patient states understanding of the procedure being performed Patient identity confirmed: verbally with patient Type: abscess Body area: upper extremity Location details: right index finger Local anesthetic: topical anesthetic Patient sedated: no Complexity: simple Drainage: purulent  Drainage amount: scant Wound treatment: wound left open Comments: Used Freeze spray for anesthesia. Created puncture wound with a 21 g needle, expressed thick purulence and bld.    (including critical care time) Labs Review Labs Reviewed  CULTURE, ROUTINE-ABSCESS   Imaging Review No results found.      MDM   1. Cellulitis of index finger, right   2. Skin pustule     Wound cult obtained I and D Soak in warm salt water 2-3 times a day.  Keflex 500 qid   Hayden Rasmussen, NP 02/25/13 1342

## 2013-02-25 NOTE — ED Notes (Signed)
Hand pain, right index finger with bump, redness, swelling, draining onset 8 days ago.  Reports family member tried to pop with a needle and did get pus.

## 2013-02-25 NOTE — ED Provider Notes (Signed)
Medical screening examination/treatment/procedure(s) were performed by resident physician or non-physician practitioner and as supervising physician I was immediately available for consultation/collaboration.   Jariah Tarkowski DOUGLAS MD.   Adasha Boehme D Cassandre Oleksy, MD 02/25/13 1639 

## 2013-02-28 LAB — CULTURE, ROUTINE-ABSCESS

## 2013-03-02 NOTE — ED Notes (Addendum)
Abscess culture R finger: Few Staph Aureus.  Pt. adequately treated with I and D and Keflex. Tara Guerra 03/02/2013

## 2013-09-26 ENCOUNTER — Other Ambulatory Visit: Payer: Self-pay | Admitting: Internal Medicine

## 2013-09-26 DIAGNOSIS — I10 Essential (primary) hypertension: Secondary | ICD-10-CM

## 2013-10-02 ENCOUNTER — Ambulatory Visit
Admission: RE | Admit: 2013-10-02 | Discharge: 2013-10-02 | Disposition: A | Discharge status: Home / Self Care | Payer: Medicare Other | Source: Ambulatory Visit | Attending: Internal Medicine | Admitting: Internal Medicine

## 2013-10-02 DIAGNOSIS — I10 Essential (primary) hypertension: Secondary | ICD-10-CM

## 2014-03-02 ENCOUNTER — Other Ambulatory Visit: Payer: Self-pay

## 2014-03-02 DIAGNOSIS — Z1231 Encounter for screening mammogram for malignant neoplasm of breast: Secondary | ICD-10-CM

## 2014-03-19 ENCOUNTER — Ambulatory Visit
Admission: RE | Admit: 2014-03-19 | Discharge: 2014-03-19 | Disposition: A | Discharge status: Home / Self Care | Payer: Commercial Managed Care - HMO | Source: Ambulatory Visit

## 2014-03-19 ENCOUNTER — Encounter (INDEPENDENT_AMBULATORY_CARE_PROVIDER_SITE_OTHER): Payer: Self-pay

## 2014-03-19 DIAGNOSIS — Z1231 Encounter for screening mammogram for malignant neoplasm of breast: Secondary | ICD-10-CM

## 2014-03-20 ENCOUNTER — Other Ambulatory Visit: Payer: Self-pay | Admitting: Internal Medicine

## 2014-03-20 DIAGNOSIS — R928 Other abnormal and inconclusive findings on diagnostic imaging of breast: Secondary | ICD-10-CM

## 2014-04-02 ENCOUNTER — Ambulatory Visit
Admission: RE | Admit: 2014-04-02 | Discharge: 2014-04-02 | Disposition: A | Discharge status: Home / Self Care | Payer: Commercial Managed Care - HMO | Source: Ambulatory Visit | Attending: Internal Medicine | Admitting: Internal Medicine

## 2014-04-02 DIAGNOSIS — R928 Other abnormal and inconclusive findings on diagnostic imaging of breast: Secondary | ICD-10-CM

## 2014-04-02 DIAGNOSIS — R922 Inconclusive mammogram: Secondary | ICD-10-CM | POA: Diagnosis not present

## 2014-07-05 DIAGNOSIS — N183 Chronic kidney disease, stage 3 (moderate): Secondary | ICD-10-CM | POA: Diagnosis not present

## 2014-07-05 DIAGNOSIS — R7309 Other abnormal glucose: Secondary | ICD-10-CM | POA: Diagnosis not present

## 2014-07-05 DIAGNOSIS — I129 Hypertensive chronic kidney disease with stage 1 through stage 4 chronic kidney disease, or unspecified chronic kidney disease: Secondary | ICD-10-CM | POA: Diagnosis not present

## 2014-07-05 DIAGNOSIS — Z79899 Other long term (current) drug therapy: Secondary | ICD-10-CM | POA: Diagnosis not present

## 2014-11-02 DIAGNOSIS — E559 Vitamin D deficiency, unspecified: Secondary | ICD-10-CM | POA: Diagnosis not present

## 2014-11-02 DIAGNOSIS — R7309 Other abnormal glucose: Secondary | ICD-10-CM | POA: Diagnosis not present

## 2014-11-02 DIAGNOSIS — E780 Pure hypercholesterolemia: Secondary | ICD-10-CM | POA: Diagnosis not present

## 2014-11-02 DIAGNOSIS — Z0000 Encounter for general adult medical examination without abnormal findings: Secondary | ICD-10-CM | POA: Diagnosis not present

## 2014-11-02 DIAGNOSIS — I129 Hypertensive chronic kidney disease with stage 1 through stage 4 chronic kidney disease, or unspecified chronic kidney disease: Secondary | ICD-10-CM | POA: Diagnosis not present

## 2014-11-02 DIAGNOSIS — N183 Chronic kidney disease, stage 3 (moderate): Secondary | ICD-10-CM | POA: Diagnosis not present

## 2014-11-17 ENCOUNTER — Observation Stay (HOSPITAL_COMMUNITY)
Admission: EM | Admit: 2014-11-17 | Discharge: 2014-11-18 | Disposition: A | Discharge status: Home / Self Care | Payer: Commercial Managed Care - HMO | Attending: Internal Medicine | Admitting: Internal Medicine

## 2014-11-17 ENCOUNTER — Encounter (HOSPITAL_COMMUNITY): Payer: Self-pay | Admitting: Nurse Practitioner

## 2014-11-17 ENCOUNTER — Emergency Department (HOSPITAL_COMMUNITY): Payer: Commercial Managed Care - HMO

## 2014-11-17 DIAGNOSIS — R079 Chest pain, unspecified: Principal | ICD-10-CM | POA: Insufficient documentation

## 2014-11-17 DIAGNOSIS — R0789 Other chest pain: Secondary | ICD-10-CM | POA: Diagnosis not present

## 2014-11-17 DIAGNOSIS — Z79899 Other long term (current) drug therapy: Secondary | ICD-10-CM | POA: Diagnosis not present

## 2014-11-17 DIAGNOSIS — Z792 Long term (current) use of antibiotics: Secondary | ICD-10-CM | POA: Insufficient documentation

## 2014-11-17 DIAGNOSIS — E780 Pure hypercholesterolemia: Secondary | ICD-10-CM | POA: Insufficient documentation

## 2014-11-17 DIAGNOSIS — Z7982 Long term (current) use of aspirin: Secondary | ICD-10-CM | POA: Diagnosis not present

## 2014-11-17 DIAGNOSIS — E785 Hyperlipidemia, unspecified: Secondary | ICD-10-CM | POA: Diagnosis present

## 2014-11-17 DIAGNOSIS — I10 Essential (primary) hypertension: Secondary | ICD-10-CM | POA: Insufficient documentation

## 2014-11-17 HISTORY — DX: Pure hypercholesterolemia, unspecified: E78.00

## 2014-11-17 LAB — BASIC METABOLIC PANEL
Anion gap: 9 (ref 5–15)
BUN: 18 mg/dL (ref 6–20)
CO2: 23 mmol/L (ref 22–32)
CREATININE: 1.34 mg/dL — AB (ref 0.44–1.00)
Calcium: 9.2 mg/dL (ref 8.9–10.3)
Chloride: 106 mmol/L (ref 101–111)
GFR calc Af Amer: 46 mL/min — ABNORMAL LOW (ref >60)
GFR, EST NON AFRICAN AMERICAN: 39 mL/min — AB (ref >60)
Glucose, Bld: 89 mg/dL (ref 65–99)
Potassium: 3.8 mmol/L (ref 3.5–5.1)
SODIUM: 138 mmol/L (ref 135–145)

## 2014-11-17 LAB — CBC
HCT: 37.2 % (ref 36.0–46.0)
Hemoglobin: 11.7 g/dL — ABNORMAL LOW (ref 12.0–15.0)
MCH: 27.1 pg (ref 26.0–34.0)
MCHC: 31.5 g/dL (ref 30.0–36.0)
MCV: 86.1 fL (ref 78.0–100.0)
PLATELETS: 226 10*3/uL (ref 150–400)
RBC: 4.32 MIL/uL (ref 3.87–5.11)
RDW: 15.2 % (ref 11.5–15.5)
WBC: 4.9 10*3/uL (ref 4.0–10.5)

## 2014-11-17 LAB — I-STAT TROPONIN, ED
TROPONIN I, POC: 0.07 ng/mL (ref 0.00–0.08)
Troponin i, poc: 0.02 ng/mL (ref 0.00–0.08)

## 2014-11-17 NOTE — ED Notes (Signed)
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 She denies SOB. She took 2  asa and pain decreased. Nothing increases the pain. A&Ox4, resp e/u

## 2014-11-17 NOTE — ED Provider Notes (Signed)
CSN: [id number]     Arrival date & time 11/17/14  1854 History   First MD Initiated Contact with Patient 11/17/14 2023     Chief Complaint  Patient presents with  . Chest Pain     (Consider location/radiation/quality/duration/timing/severity/associated sxs/prior Treatment) Patient is a 69 y.o. female presenting with chest pain.  Chest Pain Pain location:  Substernal area Pain quality: dull, pressure and sharp   Pain radiates to:  Does not radiate Pain radiates to the back: no   Onset quality:  Sudden Duration:  30 minutes Timing:  Intermittent Progression:  Resolved Context: movement and at rest   Context: not breathing   Relieved by:  Aspirin Worsened by:  Nothing tried Ineffective treatments:  None tried Associated symptoms: no abdominal pain, no cough, no fever, no lower extremity edema, no nausea, no shortness of breath, no syncope and not vomiting   Risk factors: high cholesterol, hypertension and obesity     Past Medical History  Diagnosis Date  . Hypertension   . Hypercholesteremia    History reviewed. No pertinent past surgical history. History reviewed. No pertinent family history. Social History  Substance Use Topics  . Smoking status: Never Smoker   . Smokeless tobacco: None  . Alcohol Use: No   OB History    No data available     Review of Systems  Constitutional: Negative for fever.  Respiratory: Negative for cough and shortness of breath.   Cardiovascular: Positive for chest pain. Negative for syncope.  Gastrointestinal: Negative for nausea, vomiting and abdominal pain.  All other systems reviewed and are negative.     Allergies  Review of patient's allergies indicates no known allergies.  Home Medications   Prior to Admission medications   Medication Sig Start Date End Date Taking? Authorizing Provider  aspirin EC 81 MG tablet Take 162 mg by mouth once.   Yes Historical Provider, MD  CALCIUM-VITAMIN D PO Take 1 tablet by mouth daily.   Yes  Historical Provider, MD  ibuprofen (ADVIL,MOTRIN) 200 MG tablet Take 400 mg by mouth every 6 (six) hours as needed for moderate pain.   Yes Historical Provider, MD  Ibuprofen-Diphenhydramine Cit (ADVIL PM) 200-38 MG TABS Take 1 tablet by mouth as needed (FOR SLEEP).   Yes Historical Provider, MD  Multiple Vitamin (MULTIVITAMIN WITH MINERALS) TABS tablet Take 1 tablet by mouth daily.   Yes Historical Provider, MD  olmesartan-hydrochlorothiazide (BENICAR HCT) 40-25 MG per tablet Take 1 tablet by mouth daily.     Yes Historical Provider, MD  rosuvastatin (CRESTOR) 10 MG tablet Take by mouth at bedtime.    Yes Historical Provider, MD  cephALEXin (KEFLEX) 500 MG capsule Take 1 capsule (500 mg total) by mouth 4 (four) times daily. 02/25/13   Hayden Rasmussen, NP  HYDROcodone-acetaminophen (NORCO/VICODIN) 5-325 MG per tablet Take 1 tablet by mouth every 4 (four) hours as needed. 02/25/13   Hayden Rasmussen, NP   BP 151/74 mmHg  Pulse 65  Temp(Src) 98.2 F (36.8 C) (Oral)  Resp 21  Ht  (1.727 m)  Wt 217 lb (98.431 kg)  BMI 33.00 kg/m2  SpO2 96% Physical Exam  Constitutional: She is oriented to person, place, and time. She appears well-developed and well-nourished. No distress.  HENT:  Head: Normocephalic.  Eyes: Conjunctivae are normal.  Neck: Neck supple. No tracheal deviation present.  Cardiovascular: Normal rate, regular rhythm and normal heart sounds.   Pulmonary/Chest: Effort normal and breath sounds normal. No respiratory distress.  Abdominal: Soft. She  exhibits no distension.  Neurological: She is alert and oriented to person, place, and time.  Skin: Skin is warm and dry.  Psychiatric: She has a normal mood and affect.    ED Course  Procedures (including critical care time) Labs Review Labs Reviewed  BASIC METABOLIC PANEL - Abnormal; Notable for the following:    Creatinine, Ser 1.34 (*)    GFR calc non Af Amer 39 (*)    GFR calc Af Amer 46 (*)    All other components within normal  limits  CBC - Abnormal; Notable for the following:    Hemoglobin 11.7 (*)    All other components within normal limits  I-STAT TROPOININ, ED  Rosezena Sensor, ED    Imaging Review Dg Chest 2 View  11/17/2014   CLINICAL DATA:  Acute onset of mid chest pressure and nausea. Initial encounter.  EXAM: CHEST  2 VIEW  COMPARISON:  Chest radiograph performed 12/05/2007  FINDINGS: The lungs are well-aerated and clear. There is no evidence of focal opacification, pleural effusion or pneumothorax.  The heart is normal in size; the mediastinal contour is within normal limits. No acute osseous abnormalities are seen.  IMPRESSION: No acute cardiopulmonary process seen.   Electronically Signed   By: Roanna Raider M.D.   On: 11/17/2014 21:03   I have personally reviewed and evaluated these images and lab results as part of my medical decision-making.   EKG Interpretation   Date/Time:  Saturday November 17 2014 18:59:14 EDT Ventricular Rate:  94 PR Interval:  164 QRS Duration: 88 QT Interval:  376 QTC Calculation: 470 R Axis:   62 Text Interpretation:  Normal sinus rhythm Nonspecific ST and T wave  abnormality Abnormal ECG No significant change since last tracing  Confirmed by Taesean Reth MD, Reuel Boom ([id number]) on 11/17/2014 8:24:35 PM      MDM   Final diagnoses:  Chest pain, unspecified chest pain type    69 year old female presents with central chest pressure that started while at work earlier this evening. She felt lightheaded during the episode but did not syncopized. Pain subsided after 30-45 minutes and administration of aspirin. She is currently at a pain level of 1 and resolved completely during her emergency department course. She has a heart score of 4 and is moderate risk for Mace with history of hypertension, hyperlipidemia, and obesity, her first troponin is negative. Due to the new onset nature of her pain delta troponin analysis is indicated to establish no increase.   Three-hour troponin is  not positive but is trending up from previous level. Will plan admission to the hospital for further risk stratification with serial troponins and potentially stress testing. Hospitalist was consulted for admission and will see the patient in the emergency department.     Lyndal Pulley, MD 11/18/14 [phone]

## 2014-11-17 NOTE — ED Notes (Signed)
Discussed plan of care with Dr. Clydene Pugh, pain improved at this time, no new orders. MD to see patient.

## 2014-11-18 ENCOUNTER — Observation Stay (HOSPITAL_COMMUNITY): Payer: Commercial Managed Care - HMO

## 2014-11-18 ENCOUNTER — Encounter (HOSPITAL_COMMUNITY): Payer: Self-pay | Admitting: *Deleted

## 2014-11-18 DIAGNOSIS — Z7982 Long term (current) use of aspirin: Secondary | ICD-10-CM | POA: Diagnosis not present

## 2014-11-18 DIAGNOSIS — R7309 Other abnormal glucose: Secondary | ICD-10-CM | POA: Diagnosis not present

## 2014-11-18 DIAGNOSIS — R072 Precordial pain: Secondary | ICD-10-CM | POA: Diagnosis not present

## 2014-11-18 DIAGNOSIS — E780 Pure hypercholesterolemia: Secondary | ICD-10-CM | POA: Diagnosis not present

## 2014-11-18 DIAGNOSIS — Z79899 Other long term (current) drug therapy: Secondary | ICD-10-CM | POA: Diagnosis not present

## 2014-11-18 DIAGNOSIS — Z792 Long term (current) use of antibiotics: Secondary | ICD-10-CM | POA: Diagnosis not present

## 2014-11-18 DIAGNOSIS — I209 Angina pectoris, unspecified: Secondary | ICD-10-CM | POA: Diagnosis not present

## 2014-11-18 DIAGNOSIS — I10 Essential (primary) hypertension: Secondary | ICD-10-CM | POA: Diagnosis not present

## 2014-11-18 DIAGNOSIS — R079 Chest pain, unspecified: Secondary | ICD-10-CM

## 2014-11-18 DIAGNOSIS — R0789 Other chest pain: Secondary | ICD-10-CM | POA: Diagnosis not present

## 2014-11-18 DIAGNOSIS — E669 Obesity, unspecified: Secondary | ICD-10-CM | POA: Diagnosis not present

## 2014-11-18 DIAGNOSIS — E785 Hyperlipidemia, unspecified: Secondary | ICD-10-CM | POA: Diagnosis not present

## 2014-11-18 LAB — BASIC METABOLIC PANEL
ANION GAP: 7 (ref 5–15)
BUN: 14 mg/dL (ref 6–20)
CALCIUM: 8.9 mg/dL (ref 8.9–10.3)
CO2: 23 mmol/L (ref 22–32)
CREATININE: 1.11 mg/dL — AB (ref 0.44–1.00)
Chloride: 108 mmol/L (ref 101–111)
GFR, EST AFRICAN AMERICAN: 57 mL/min — AB (ref >60)
GFR, EST NON AFRICAN AMERICAN: 49 mL/min — AB (ref >60)
Glucose, Bld: 87 mg/dL (ref 65–99)
Potassium: 3.4 mmol/L — ABNORMAL LOW (ref 3.5–5.1)
SODIUM: 138 mmol/L (ref 135–145)

## 2014-11-18 LAB — LIPID PANEL
Cholesterol: 156 mg/dL (ref 0–200)
HDL: 58 mg/dL (ref >40)
LDL CALC: 92 mg/dL (ref 0–99)
TRIGLYCERIDES: 32 mg/dL (ref <150)
Total CHOL/HDL Ratio: 2.7 RATIO
VLDL: 6 mg/dL (ref 0–40)

## 2014-11-18 LAB — CBC
HCT: 35.7 % — ABNORMAL LOW (ref 36.0–46.0)
HEMOGLOBIN: 11.9 g/dL — AB (ref 12.0–15.0)
MCH: 28.7 pg (ref 26.0–34.0)
MCHC: 33.3 g/dL (ref 30.0–36.0)
MCV: 86 fL (ref 78.0–100.0)
PLATELETS: 188 10*3/uL (ref 150–400)
RBC: 4.15 MIL/uL (ref 3.87–5.11)
RDW: 15.3 % (ref 11.5–15.5)
WBC: 4.6 10*3/uL (ref 4.0–10.5)

## 2014-11-18 LAB — TROPONIN I: TROPONIN I: 0.03 ng/mL (ref <0.031)

## 2014-11-18 MED ORDER — ONDANSETRON HCL 4 MG/2ML IJ SOLN: 4.0000 mg | Freq: Four times a day (QID) | INTRAMUSCULAR | Status: DC | PRN

## 2014-11-18 MED ORDER — SODIUM CHLORIDE 0.9 % IV SOLN: 250.0000 mL | INTRAVENOUS | Status: DC | PRN

## 2014-11-18 MED ORDER — IRBESARTAN 150 MG PO TABS
300.0000 mg | ORAL_TABLET | Freq: Every day | ORAL | Status: DC
  Administered 2014-11-18: 300 mg via ORAL
  Filled 2014-11-18: qty 2

## 2014-11-18 MED ORDER — ENSURE ENLIVE PO LIQD: 237.0000 mL | Freq: Two times a day (BID) | ORAL | Status: DC

## 2014-11-18 MED ORDER — TECHNETIUM TC 99M SESTAMIBI GENERIC - CARDIOLITE
10.7000 | Freq: Once | INTRAVENOUS | Status: AC | PRN
  Administered 2014-11-18: 11 via INTRAVENOUS

## 2014-11-18 MED ORDER — ALUM & MAG HYDROXIDE-SIMETH 200-200-20 MG/5ML PO SUSP: 30.0000 mL | Freq: Four times a day (QID) | ORAL | Status: DC | PRN

## 2014-11-18 MED ORDER — TECHNETIUM TC 99M SESTAMIBI GENERIC - CARDIOLITE
30.1000 | Freq: Once | INTRAVENOUS | Status: AC | PRN
  Administered 2014-11-18: 30.1 via INTRAVENOUS

## 2014-11-18 MED ORDER — SODIUM CHLORIDE 0.9 % IJ SOLN
3.0000 mL | Freq: Two times a day (BID) | INTRAMUSCULAR | Status: DC
  Administered 2014-11-18 (×2): 3 mL via INTRAVENOUS

## 2014-11-18 MED ORDER — HYDROCHLOROTHIAZIDE 25 MG PO TABS
25.0000 mg | ORAL_TABLET | Freq: Every day | ORAL | Status: DC
  Administered 2014-11-18: 25 mg via ORAL
  Filled 2014-11-18: qty 1

## 2014-11-18 MED ORDER — CALCIUM CARBONATE-VITAMIN D 500-200 MG-UNIT PO TABS
1.0000 | ORAL_TABLET | Freq: Every day | ORAL | Status: DC
  Administered 2014-11-18: 1 via ORAL
  Filled 2014-11-18: qty 1

## 2014-11-18 MED ORDER — ACETAMINOPHEN 650 MG RE SUPP: 650.0000 mg | Freq: Four times a day (QID) | RECTAL | Status: DC | PRN

## 2014-11-18 MED ORDER — HYDROMORPHONE HCL 1 MG/ML IJ SOLN
0.5000 mg | INTRAMUSCULAR | Status: DC | PRN
  Administered 2014-11-18: 0.5 mg via INTRAVENOUS
  Filled 2014-11-18: qty 1

## 2014-11-18 MED ORDER — ENOXAPARIN SODIUM 40 MG/0.4ML ~~LOC~~ SOLN
40.0000 mg | Freq: Every day | SUBCUTANEOUS | Status: DC
  Administered 2014-11-18: 40 mg via SUBCUTANEOUS
  Filled 2014-11-18: qty 0.4

## 2014-11-18 MED ORDER — ROSUVASTATIN CALCIUM 10 MG PO TABS: 10.0000 mg | ORAL_TABLET | Freq: Every day | ORAL | Status: DC

## 2014-11-18 MED ORDER — ALPRAZOLAM 0.25 MG PO TABS: 0.2500 mg | ORAL_TABLET | Freq: Every evening | ORAL | Status: DC | PRN

## 2014-11-18 MED ORDER — REGADENOSON 0.4 MG/5ML IV SOLN
0.4000 mg | Freq: Once | INTRAVENOUS | Status: DC
  Filled 2014-11-18: qty 5

## 2014-11-18 MED ORDER — OXYCODONE HCL 5 MG PO TABS: 5.0000 mg | ORAL_TABLET | ORAL | Status: DC | PRN

## 2014-11-18 MED ORDER — SODIUM CHLORIDE 0.9 % IJ SOLN: 3.0000 mL | INTRAMUSCULAR | Status: DC | PRN

## 2014-11-18 MED ORDER — NITROGLYCERIN 2 % TD OINT
0.5000 [in_us] | TOPICAL_OINTMENT | Freq: Four times a day (QID) | TRANSDERMAL | Status: DC
  Administered 2014-11-18 (×2): 0.5 [in_us] via TOPICAL
  Filled 2014-11-18: qty 30

## 2014-11-18 MED ORDER — REGADENOSON 0.4 MG/5ML IV SOLN
INTRAVENOUS | Status: AC
  Filled 2014-11-18: qty 5

## 2014-11-18 MED ORDER — REGADENOSON 0.4 MG/5ML IV SOLN
0.4000 mg | Freq: Once | INTRAVENOUS | Status: AC
  Administered 2014-11-18: 0.4 mg via INTRAVENOUS
  Filled 2014-11-18: qty 5

## 2014-11-18 MED ORDER — OLMESARTAN MEDOXOMIL-HCTZ 40-25 MG PO TABS: 1.0000 | ORAL_TABLET | Freq: Every day | ORAL | Status: DC

## 2014-11-18 MED ORDER — ACETAMINOPHEN 325 MG PO TABS: 650.0000 mg | ORAL_TABLET | Freq: Four times a day (QID) | ORAL | Status: DC | PRN

## 2014-11-18 MED ORDER — ONDANSETRON HCL 4 MG PO TABS: 4.0000 mg | ORAL_TABLET | Freq: Four times a day (QID) | ORAL | Status: DC | PRN

## 2014-11-18 NOTE — ED Notes (Signed)
Spoke to Dr. Lovell Sheehan in regards to patient's allergy, patient allowed to transfer to new unit off telemetry.

## 2014-11-18 NOTE — Consult Note (Signed)
Reason for Consult: Chest pain Referring Physician: [facility]  Tara Guerra is an 69 y.o. female.  HPI: Patient is 69 year old female with past medical history significant for hypertension and hyperlipidemia, borderline diabetes mellitus controlled by diet, morbid obesity, was admitted last night because of retrosternal chest pain described as pressure around grade 10 over 10 associated with nausea and diaphoresis while at work states chest pain lasted approximately 30 minutes and relieved with the to aspirin. Patient presently denies any chest pain. Denies history of exertional chest pain or dyspnea. Denies palpitation lightheadedness or syncope. Denies PND orthopnea leg swelling. EKG done in the ED showed no acute ischemic changes as ruled out by serial cardiac enzymes. States had a stress test many years ago no recent cardiac workup.  Past Medical History  Diagnosis Date  . Hypertension   . Hypercholesteremia     History reviewed. No pertinent past surgical history.  Family History  Problem Relation Age of Onset  . Alzheimer's disease Mother   . Heart attack Father     Social History:  reports that she has never smoked. She has never used smokeless tobacco. She reports that she does not drink alcohol or use illicit drugs.  Allergies: No Known Allergies  Medications: I have reviewed the patient's current medications.  Results for orders placed or performed during the hospital encounter of 11/17/14 (from the past 48 hour(s))  I-stat troponin, ED     Status: None   Collection Time: 11/17/14  7:56 PM  Result Value Ref Range   Troponin i, poc 0.02 0.00 - 0.08 ng/mL   Comment 3            Comment: Due to the release kinetics of cTnI, a negative result within the first hours of the onset of symptoms does not rule out myocardial infarction with certainty. If myocardial infarction is still suspected, repeat the test at appropriate intervals.   Basic metabolic panel      Status: Abnormal   Collection Time: 11/17/14  8:12 PM  Result Value Ref Range   Sodium 138 135 - 145 mmol/L   Potassium 3.8 3.5 - 5.1 mmol/L   Chloride 106 101 - 111 mmol/L   CO2 23 22 - 32 mmol/L   Glucose, Bld 89 65 - 99 mg/dL   BUN 18 6 - 20 mg/dL   Creatinine, Ser 1.34 (H) 0.44 - 1.00 mg/dL   Calcium 9.2 8.9 - 10.3 mg/dL   GFR calc non Af Amer 39 (L) >60 mL/min   GFR calc Af Amer 46 (L) >60 mL/min    Comment: (NOTE) The eGFR has been calculated using the CKD EPI equation. This calculation has not been validated in all clinical situations. eGFR's persistently <60 mL/min signify possible Chronic Kidney Disease.    Anion gap 9 5 - 15  CBC     Status: Abnormal   Collection Time: 11/17/14  8:12 PM  Result Value Ref Range   WBC 4.9 4.0 - 10.5 K/uL   RBC 4.32 3.87 - 5.11 MIL/uL   Hemoglobin 11.7 (L) 12.0 - 15.0 g/dL   HCT 37.2 36.0 - 46.0 %   MCV 86.1 78.0 - 100.0 fL   MCH 27.1 26.0 - 34.0 pg   MCHC 31.5 30.0 - 36.0 g/dL   RDW 15.2 11.5 - 15.5 %   Platelets 226 150 - 400 K/uL  I-Stat Troponin, ED (not at [facility])     Status: None   Collection Time: 11/17/14 11:10 PM  Result  Value Ref Range   Troponin i, poc 0.07 0.00 - 0.08 ng/mL   Comment 3            Comment: Due to the release kinetics of cTnI, a negative result within the first hours of the onset of symptoms does not rule out myocardial infarction with certainty. If myocardial infarction is still suspected, repeat the test at appropriate intervals.   Basic metabolic panel     Status: Abnormal   Collection Time: 11/18/14  2:58 AM  Result Value Ref Range   Sodium 138 135 - 145 mmol/L   Potassium 3.4 (L) 3.5 - 5.1 mmol/L   Chloride 108 101 - 111 mmol/L   CO2 23 22 - 32 mmol/L   Glucose, Bld 87 65 - 99 mg/dL   BUN 14 6 - 20 mg/dL   Creatinine, Ser 1.11 (H) 0.44 - 1.00 mg/dL   Calcium 8.9 8.9 - 10.3 mg/dL   GFR calc non Af Amer 49 (L) >60 mL/min   GFR calc Af Amer 57 (L) >60 mL/min    Comment: (NOTE) The eGFR has  been calculated using the CKD EPI equation. This calculation has not been validated in all clinical situations. eGFR's persistently <60 mL/min signify possible Chronic Kidney Disease.    Anion gap 7 5 - 15  CBC     Status: Abnormal   Collection Time: 11/18/14  2:58 AM  Result Value Ref Range   WBC 4.6 4.0 - 10.5 K/uL   RBC 4.15 3.87 - 5.11 MIL/uL   Hemoglobin 11.9 (L) 12.0 - 15.0 g/dL   HCT 35.7 (L) 36.0 - 46.0 %   MCV 86.0 78.0 - 100.0 fL   MCH 28.7 26.0 - 34.0 pg   MCHC 33.3 30.0 - 36.0 g/dL   RDW 15.3 11.5 - 15.5 %   Platelets 188 150 - 400 K/uL  Troponin I     Status: None   Collection Time: 11/18/14  2:59 AM  Result Value Ref Range   Troponin I 0.03 <0.031 ng/mL    Comment:        NO INDICATION OF MYOCARDIAL INJURY.   Lipid panel     Status: None   Collection Time: 11/18/14  2:59 AM  Result Value Ref Range   Cholesterol 156 0 - 200 mg/dL   Triglycerides 32 <150 mg/dL   HDL 58 >40 mg/dL   Total CHOL/HDL Ratio 2.7 RATIO   VLDL 6 0 - 40 mg/dL   LDL Cholesterol 92 0 - 99 mg/dL    Comment:        Total Cholesterol/HDL:CHD Risk Coronary Heart Disease Risk Table                     Men   Women  1/2 Average Risk   3.4   3.3  Average Risk       5.0   4.4  2 X Average Risk   9.6   7.1  3 X Average Risk  23.4   11.0        Use the calculated Patient Ratio above and the CHD Risk Table to determine the patient's CHD Risk.        ATP III CLASSIFICATION (LDL):  <100     mg/dL   Optimal  100-129  mg/dL   Near or Above                    Optimal  130-159  mg/dL   Borderline  160-189  mg/dL   High  >190     mg/dL   Very High   Troponin I     Status: None   Collection Time: 11/18/14  7:52 AM  Result Value Ref Range   Troponin I <0.03 <0.031 ng/mL    Comment:        NO INDICATION OF MYOCARDIAL INJURY.     Dg Chest 2 View  11/17/2014   CLINICAL DATA:  Acute onset of mid chest pressure and nausea. Initial encounter.  EXAM: CHEST  2 VIEW  COMPARISON:  Chest radiograph  performed 12/05/2007  FINDINGS: The lungs are well-aerated and clear. There is no evidence of focal opacification, pleural effusion or pneumothorax.  The heart is normal in size; the mediastinal contour is within normal limits. No acute osseous abnormalities are seen.  IMPRESSION: No acute cardiopulmonary process seen.   Electronically Signed   By: Garald Balding M.D.   On: 11/17/2014 21:03    Review of Systems  Constitutional: Negative for fever and chills.  HENT: Negative for hearing loss.   Eyes: Negative for blurred vision and double vision.  Respiratory: Positive for shortness of breath. Negative for cough and hemoptysis.   Cardiovascular: Positive for chest pain. Negative for palpitations, orthopnea, claudication, leg swelling and PND.  Gastrointestinal: Positive for nausea. Negative for abdominal pain.  Genitourinary: Negative for dysuria.  Neurological: Negative for dizziness and headaches.   Blood pressure 158/79, pulse 98, temperature 98.6 F (37 C), temperature source Oral, resp. rate 16, height '5\' 8"'  (1.727 m), weight 96.389 kg (212 lb 8 oz), SpO2 93 %. Physical Exam  Constitutional: She is oriented to person, place, and time. She appears well-developed and well-nourished.  HENT:  Head: Normocephalic and atraumatic.  Eyes: Conjunctivae are normal. Left eye exhibits no discharge. No scleral icterus.  Neck: Normal range of motion. Neck supple. No JVD present. No tracheal deviation present. No thyromegaly present.  Cardiovascular: Normal rate and regular rhythm.   Murmur (Soft systolic murmur noted no S3 gallop) heard. Respiratory: Effort normal and breath sounds normal. No respiratory distress. She has no wheezes. She has no rales.  GI: Soft. Bowel sounds are normal. She exhibits no distension. There is no tenderness. There is no rebound and no guarding.  Musculoskeletal: She exhibits no edema or tenderness.  Neurological: She is alert and oriented to person, place, and time.     Assessment/Plan: New-onset angina MI ruled out rule out coronary insufficiency Hypertension Borderline diabetes mellitus controlled by diet Morbid obesity Hypercholesteremia  Plan Agree with present management Add low-dose beta blockers as tolerated Schedule for nuclear stress test today  Charolette Forward 11/18/2014, 11:11 AM

## 2014-11-18 NOTE — ED Notes (Signed)
Dr. Jenkins is at the bedside.  

## 2014-11-18 NOTE — Progress Notes (Signed)
To whom it may concern:   Tara Guerra is unable to work from 11/17/14 through 11/19/14 due to illness.   Sincerely,     Crista Curb, MD [facility]

## 2014-11-18 NOTE — Discharge Summary (Signed)
Physician Discharge Summary  Tara Guerra ZOX:[id number] DOB: [birth year] DOA: 11/17/2014  PCP: Dorothyann Peng  Admit date: 11/17/2014 Discharge date: 11/18/2014  Discharge Diagnoses:  Principal Problem:   Chest pain: noncardiac, possibly panic disorder Active Problems:   Hyperlipidemia   Essential hypertension, benign  Discharge Condition: stable  Diet recommendation: heart healthy  Filed Weights   11/17/14 1902 11/18/14 0131  Weight: 98.431 kg (217 lb) 96.389 kg (212 lb 8 oz)    History of present illness:  69 y.o. female with a history of HTN, and Hyperlipidemia who presents to the ED with complaints of mid-chest area pain rated at 10/10 described as sharp and pressure-like associated with nausea and diaphoresis and without radiation. Her pain lasted for 45 minutes and occurred with exertion, while she was at work. She is a non-smoker, but has a family history of Heart Disease in her Father. She was brought to the ED and evaluated and the initial Troponins were negative but trending upward, from 0.02 to 0.7. She was found to have a heart score of 4, and she was referred for a Cardiac Rule out.   Hospital Course:  Observed on telemetry. MI ruled out. Dr. Sharyn Lull consulted for [facility].  Myoview low risk with normal EF.  Patient admits she has been under a lot of stress, and thinks she may have had a panic attack. Gave Rx for 5 xanax to try if symptoms return  Procedures:  none  Consultations:  Harwani  Discharge Exam: Filed Vitals:   11/18/14 1245  BP: 143/72  Pulse: 70  Temp: 98.4 F (36.9 C)  Resp: 18   Gen: comfortable Lungs CTA CV RRR Chest: no chest wall tenderness  Discharge Instructions   Discharge Instructions    Activity as tolerated - No restrictions    Complete by:  As directed      Diet - low sodium heart healthy    Complete by:  As directed      Diet Carb Modified    Complete by:  As directed           Current Discharge Medication List     START taking these medications   Details  ALPRAZolam (XANAX) 0.25 MG tablet Take 1 tablet (0.25 mg total) by mouth at bedtime as needed (panic attack). Qty: 5 tablet, Refills: 0      CONTINUE these medications which have NOT CHANGED   Details  aspirin EC 81 MG tablet Take 162 mg by mouth once.    CALCIUM-VITAMIN D PO Take 1 tablet by mouth daily.    ibuprofen (ADVIL,MOTRIN) 200 MG tablet Take 400 mg by mouth every 6 (six) hours as needed for moderate pain.    Ibuprofen-Diphenhydramine Cit (ADVIL PM) 200-38 MG TABS Take 1 tablet by mouth as needed (FOR SLEEP).    Multiple Vitamin (MULTIVITAMIN WITH MINERALS) TABS tablet Take 1 tablet by mouth daily.    olmesartan-hydrochlorothiazide (BENICAR HCT) 40-25 MG per tablet Take 1 tablet by mouth daily.      rosuvastatin (CRESTOR) 10 MG tablet Take by mouth at bedtime.       STOP taking these medications     cephALEXin (KEFLEX) 500 MG capsule      HYDROcodone-acetaminophen (NORCO/VICODIN) 5-325 MG per tablet        No Known Allergies Follow-up Information    Follow up with your primary care provider.   Why:  If symptoms worsen       The results of significant diagnostics from this hospitalization (including  imaging, microbiology, ancillary and laboratory) are listed below for reference.    Significant Diagnostic Studies: Dg Chest 2 View  11/17/2014   CLINICAL DATA:  Acute onset of mid chest pressure and nausea. Initial encounter.  EXAM: CHEST  2 VIEW  COMPARISON:  Chest radiograph performed 12/05/2007  FINDINGS: The lungs are well-aerated and clear. There is no evidence of focal opacification, pleural effusion or pneumothorax.  The heart is normal in size; the mediastinal contour is within normal limits. No acute osseous abnormalities are seen.  IMPRESSION: No acute cardiopulmonary process seen.   Electronically Signed   By: Roanna Raider M.D.   On: 11/17/2014 21:03   Nm Myocar Multi W/spect W/wall Motion / Ef  11/18/2014    CLINICAL DATA:  69 year old female with chest pain  EXAM: MYOCARDIAL IMAGING WITH SPECT (REST AND PHARMACOLOGIC-STRESS)  GATED LEFT VENTRICULAR WALL MOTION STUDY  LEFT VENTRICULAR EJECTION FRACTION  TECHNIQUE: Standard myocardial SPECT imaging was performed after resting intravenous injection of 10 mCi Tc-86m sestamibi. Subsequently, intravenous infusion of Lexiscan was performed under the supervision of the Cardiology staff. At peak effect of the drug, 30 mCi Tc-38m sestamibi was injected intravenously and standard myocardial SPECT imaging was performed. Quantitative gated imaging was also performed to evaluate left ventricular wall motion, and estimate left ventricular ejection fraction.  COMPARISON:  Chest x-ray 11/17/2014  FINDINGS: Perfusion: No decreased activity in the left ventricle on stress imaging to suggest reversible ischemia or infarction.  Wall Motion: Normal left ventricular wall motion. No left ventricular dilation.  Left Ventricular Ejection Fraction: 68 %  End diastolic volume 66 ml  End systolic volume 21 ml  IMPRESSION: 1. No reversible ischemia or infarction.  2. Normal left ventricular wall motion.  3. Left ventricular ejection fraction 68%  4. Low-risk stress test findings*.  *2012 Appropriate Use Criteria for Coronary Revascularization Focused Update: J Am Coll Cardiol. 2012;59(9):857-881. http://content.dementiazones.com.aspx?articleid=1201161   Electronically Signed   By: Malachy Moan M.D.   On: 11/18/2014 12:24    Microbiology: No results found for this or any previous visit (from the past 240 hour(s)).   Labs: Basic Metabolic Panel:  Recent Labs Lab 11/17/14 2012 11/18/14 0258  NA 138 138  K 3.8 3.4*  CL 106 108  CO2 23 23  GLUCOSE 89 87  BUN 18 14  CREATININE 1.34* 1.11*  CALCIUM 9.2 8.9   Liver Function Tests: No results for input(s): AST, ALT, ALKPHOS, BILITOT, PROT, ALBUMIN in the last 168 hours. No results for input(s): LIPASE, AMYLASE in the last  168 hours. No results for input(s): AMMONIA in the last 168 hours. CBC:  Recent Labs Lab 11/17/14 2012 11/18/14 0258  WBC 4.9 4.6  HGB 11.7* 11.9*  HCT 37.2 35.7*  MCV 86.1 86.0  PLT 226 188   Cardiac Enzymes:  Recent Labs Lab 11/18/14 0259 11/18/14 0752  TROPONINI 0.03 <0.03   BNP: BNP (last 3 results) No results for input(s): BNP in the last 8760 hours.  ProBNP (last 3 results) No results for input(s): PROBNP in the last 8760 hours.  CBG: No results for input(s): GLUCAP in the last 168 hours.     SignedChristiane Ha  [facility] 11/18/2014, 1:34 PM

## 2014-11-18 NOTE — Progress Notes (Signed)
Admitted after midnight. Chart reviewed. Patient examined. EKG and troponins ok. No further CP. PCP is Velna Hatchet.  D/w Dr. Algie Coffer, on call. Ok to proceed with myoview. Have ordered. Keep NPO.  Crista Curb, MD [facility]

## 2014-11-18 NOTE — H&P (Signed)
[facility] Admission History and Physical       Tara Guerra ZOX:[id number] DOB: February 02, 1946 DOA: 11/17/2014  Referring physician: EDP PCP: Dr. Velna Hatchet Specialists:   Chief Complaint:  Chest Pain   HPI: Tara Guerra is a 69 y.o. female with a history of HTN, and Hyperlipidemia who presents to the ED with complaints of mid-chest area pain rated at 10/10 described as sharp and pressure-like associated with nausea and diaphoresis and without radiation.   Her pain lasted for 45 minutes and occurred with exertion, while she was at work.  She is a non-smoker, but has a family history of Heart Disease in her Father.   She was brought to the ED and evaluated and the initial Troponins were negative but trending upward, from 0.02 to 0.7.  She was found to have a heart score of 4, and she was referred for a Cardiac Rule out.      Review of Systems:  Constitutional: No Weight Loss, No Weight Gain, Night Sweats, Fevers, Chills, Dizziness, Light Headedness, Fatigue, or Generalized Weakness HEENT: No Headaches, Difficulty Swallowing,Tooth/Dental Problems,Sore Throat,  No Sneezing, Rhinitis, Ear Ache, Nasal Congestion, or Post Nasal Drip,  Cardio-vascular:  +Chest pain, Orthopnea, PND, Edema in Lower Extremities, Anasarca, Dizziness, Palpitations  Resp: No Dyspnea, No DOE, No Productive Cough, No Non-Productive Cough, No Hemoptysis, No Wheezing.    GI: No Heartburn, Indigestion, Abdominal Pain, +Nausea, Vomiting, Diarrhea, Constipation, Hematemesis, Hematochezia, Melena, Change in Bowel Habits,  Loss of Appetite  GU: No Dysuria, No Change in Color of Urine, No Urgency or Urinary Frequency, No Flank pain.  Musculoskeletal: No Joint Pain or Swelling, No Decreased Range of Motion, No Back Pain.  Neurologic: No Syncope, No Seizures, Muscle Weakness, Paresthesia, Vision Disturbance or Loss, No Diplopia, No Vertigo, No Difficulty Walking,  Skin: No Rash or Lesions. Psych: No Change in Mood or  Affect, No Depression or Anxiety, No Memory loss, No Confusion, or Hallucinations   Past Medical History  Diagnosis Date  . Hypertension   . Hypercholesteremia      History reviewed. No pertinent past surgical history.    Prior to Admission medications   Medication Sig Start Date End Date Taking? Authorizing Provider  aspirin EC 81 MG tablet Take 162 mg by mouth once.   Yes Historical Provider, MD  CALCIUM-VITAMIN D PO Take 1 tablet by mouth daily.   Yes Historical Provider, MD  ibuprofen (ADVIL,MOTRIN) 200 MG tablet Take 400 mg by mouth every 6 (six) hours as needed for moderate pain.   Yes Historical Provider, MD  Ibuprofen-Diphenhydramine Cit (ADVIL PM) 200-38 MG TABS Take 1 tablet by mouth as needed (FOR SLEEP).   Yes Historical Provider, MD  Multiple Vitamin (MULTIVITAMIN WITH MINERALS) TABS tablet Take 1 tablet by mouth daily.   Yes Historical Provider, MD  olmesartan-hydrochlorothiazide (BENICAR HCT) 40-25 MG per tablet Take 1 tablet by mouth daily.     Yes Historical Provider, MD  rosuvastatin (CRESTOR) 10 MG tablet Take by mouth at bedtime.    Yes Historical Provider, MD  cephALEXin (KEFLEX) 500 MG capsule Take 1 capsule (500 mg total) by mouth 4 (four) times daily. 02/25/13   Hayden Rasmussen, NP  HYDROcodone-acetaminophen (NORCO/VICODIN) 5-325 MG per tablet Take 1 tablet by mouth every 4 (four) hours as needed. 02/25/13   Hayden Rasmussen, NP     No Known Allergies  Social History:  reports that she has never smoked. She does not have any smokeless tobacco history on file. She reports  that she does not drink alcohol or use illicit drugs.    History reviewed. No pertinent family history.     Physical Exam:  GEN:  Pleasant Well Nourished and Well Developed  69 y.o. African American female examined and in no acute distress; cooperative with exam Filed Vitals:   11/17/14 2230 11/17/14 2245 11/17/14 2315 11/17/14 2330  BP: 144/61 145/67 153/69 153/67  Pulse: 65 63 60 64  Temp:        TempSrc:      Resp: 22 22 19 19   Height:      Weight:      SpO2: 98% 97% 97% 95%   Blood pressure 153/67, pulse 64, temperature 98.2 F (36.8 C), temperature source Oral, resp. rate 19, height 5\' 8"  (1.727 m), weight 98.431 kg (217 lb), SpO2 95 %. PSYCH: She is alert and oriented x4; does not appear anxious does not appear depressed; affect is normal HEENT: Normocephalic and Atraumatic, Mucous membranes pink; PERRLA; EOM intact; Fundi:  Benign;  No scleral icterus, Nares: Patent, Oropharynx: Clear, Edentulous with "Dentures,    Neck:  FROM, No Cervical Lymphadenopathy nor Thyromegaly or Carotid Bruit; No JVD; Breasts:: Not examined CHEST WALL: No tenderness CHEST: Normal respiration, clear to auscultation bilaterally HEART: Regular rate and rhythm; no murmurs rubs or gallops BACK: No kyphosis or scoliosis; No CVA tenderness ABDOMEN: Positive Bowel Sounds, Scaphoid, Obese, Soft Non-Tender, No Rebound or Guarding; No Masses, No Organomegaly, No Pannus; No Intertriginous candida. Rectal Exam: Not done EXTREMITIES: No Cyanosis, Clubbing, or Edema; No Ulcerations. Genitalia: not examined PULSES: 2+ and symmetric SKIN: Normal hydration no rash or ulceration CNS:  Alert and Oriented x 4, No Focal Deficits Vascular: pulses palpable throughout    Labs on Admission:  Basic Metabolic Panel:  Recent Labs Lab 11/17/14 2012  NA 138  K 3.8  CL 106  CO2 23  GLUCOSE 89  BUN 18  CREATININE 1.34*  CALCIUM 9.2   Liver Function Tests: No results for input(s): AST, ALT, ALKPHOS, BILITOT, PROT, ALBUMIN in the last 168 hours. No results for input(s): LIPASE, AMYLASE in the last 168 hours. No results for input(s): AMMONIA in the last 168 hours. CBC:  Recent Labs Lab 11/17/14 2012  WBC 4.9  HGB 11.7*  HCT 37.2  MCV 86.1  PLT 226   Cardiac Enzymes: No results for input(s): CKTOTAL, CKMB, CKMBINDEX, TROPONINI in the last 168 hours.  BNP (last 3 results) No results for input(s): BNP  in the last 8760 hours.  ProBNP (last 3 results) No results for input(s): PROBNP in the last 8760 hours.  CBG: No results for input(s): GLUCAP in the last 168 hours.  Radiological Exams on Admission: Dg Chest 2 View  11/17/2014   CLINICAL DATA:  Acute onset of mid chest pressure and nausea. Initial encounter.  EXAM: CHEST  2 VIEW  COMPARISON:  Chest radiograph performed 12/05/2007  FINDINGS: The lungs are well-aerated and clear. There is no evidence of focal opacification, pleural effusion or pneumothorax.  The heart is normal in size; the mediastinal contour is within normal limits. No acute osseous abnormalities are seen.  IMPRESSION: No acute cardiopulmonary process seen.   Electronically Signed   By: Roanna Raider M.D.   On: 11/17/2014 21:03     EKG: Independently reviewed. Normal sinus Rhythm Rate =61,  Low Voltage Changes   Assessment/Plan:   69 y.o. female with   Principal Problem:   1.    Chest pain   Cardiac Monitoring  Cycle Troponins   Nitropaste, O2 , ASA   Consider Stress Testing if Negative W/u   2D ECHO IN AM   Check Fasting Lipids       Active Problems:   2.    Hyperlipidemia   Continue Rosuvastatin    Check Fastng Lipds in AM     3.    Essential hypertension, benign   Continue Benicar/HCTZ   Monitor BPs     4.    DVT Prophylaxis   Lovenox       Code Status:     FULL CODE        Family Communication:   Family at Bedside Disposition Plan:    Observation Status        Time spent:  49 Minutes      Ron Parker [facility] Pager [phone]   If 7AM -7PM Please Contact the Day Rounding Team MD for [facility]  If 7PM-7AM, Please Contact Night-Floor Coverage  [email or web address] Password [email or web address] 11/18/2014, 1:18 AM     ADDENDUM:   Patient was seen and examined on 11/18/2014

## 2014-11-23 DIAGNOSIS — N183 Chronic kidney disease, stage 3 (moderate): Secondary | ICD-10-CM | POA: Diagnosis not present

## 2014-11-23 DIAGNOSIS — I129 Hypertensive chronic kidney disease with stage 1 through stage 4 chronic kidney disease, or unspecified chronic kidney disease: Secondary | ICD-10-CM | POA: Diagnosis not present

## 2014-11-23 DIAGNOSIS — Z79899 Other long term (current) drug therapy: Secondary | ICD-10-CM | POA: Diagnosis not present

## 2014-11-23 DIAGNOSIS — R079 Chest pain, unspecified: Secondary | ICD-10-CM | POA: Diagnosis not present

## 2014-11-23 DIAGNOSIS — Z23 Encounter for immunization: Secondary | ICD-10-CM | POA: Diagnosis not present

## 2014-11-23 DIAGNOSIS — Z1389 Encounter for screening for other disorder: Secondary | ICD-10-CM | POA: Diagnosis not present

## 2014-12-18 DIAGNOSIS — Z23 Encounter for immunization: Secondary | ICD-10-CM | POA: Diagnosis not present

## 2014-12-18 DIAGNOSIS — I129 Hypertensive chronic kidney disease with stage 1 through stage 4 chronic kidney disease, or unspecified chronic kidney disease: Secondary | ICD-10-CM | POA: Diagnosis not present

## 2014-12-18 DIAGNOSIS — N183 Chronic kidney disease, stage 3 (moderate): Secondary | ICD-10-CM | POA: Diagnosis not present

## 2014-12-18 DIAGNOSIS — R079 Chest pain, unspecified: Secondary | ICD-10-CM | POA: Diagnosis not present

## 2014-12-18 DIAGNOSIS — Z1389 Encounter for screening for other disorder: Secondary | ICD-10-CM | POA: Diagnosis not present

## 2014-12-18 DIAGNOSIS — Z79899 Other long term (current) drug therapy: Secondary | ICD-10-CM | POA: Diagnosis not present

## 2015-02-03 ENCOUNTER — Ambulatory Visit (INDEPENDENT_AMBULATORY_CARE_PROVIDER_SITE_OTHER): Payer: Commercial Managed Care - HMO | Admitting: Family Medicine

## 2015-02-03 ENCOUNTER — Ambulatory Visit (INDEPENDENT_AMBULATORY_CARE_PROVIDER_SITE_OTHER): Payer: Commercial Managed Care - HMO

## 2015-02-03 VITALS — BP 124/70 | HR 68 | Temp 98.5°F | Resp 17 | Ht 66.75 in | Wt 214.6 lb

## 2015-02-03 DIAGNOSIS — M25441 Effusion, right hand: Secondary | ICD-10-CM

## 2015-02-03 DIAGNOSIS — M129 Arthropathy, unspecified: Secondary | ICD-10-CM | POA: Diagnosis not present

## 2015-02-03 DIAGNOSIS — M13141 Monoarthritis, not elsewhere classified, right hand: Secondary | ICD-10-CM | POA: Diagnosis not present

## 2015-02-03 DIAGNOSIS — Z23 Encounter for immunization: Secondary | ICD-10-CM

## 2015-02-03 LAB — URIC ACID: Uric Acid, Serum: 6.5 mg/dL (ref 2.4–7.0)

## 2015-02-03 LAB — SEDIMENTATION RATE: SED RATE: 22 mm/h (ref 0–30)

## 2015-02-03 LAB — RHEUMATOID FACTOR

## 2015-02-03 MED ORDER — MELOXICAM 7.5 MG PO TABS: 7.5000 mg | ORAL_TABLET | Freq: Every day | ORAL | Status: DC

## 2015-02-03 NOTE — Progress Notes (Signed)
Subjective:  This chart was scribed for Meredith StaggersJeffrey Javonn Gauger, MD by Andrew Auaven Small, ED Scribe. This patient was seen in room 8 and the patient's care was started at 11:34 AM.  Patient ID: Tara Guerra, female    DOB: October 05, 1945, 69 y.o.   MRN: [id number]  HPI Chief Complaint  Patient presents with  . Hand Pain    Right middle finger, X 2 weeks, swelling  . Flu Vaccine    HPI Comments: Tara Guerra is a 69 y.o. female who presents to the [facility] complaining of right middle finger swelling that began 2 weeks ago. She denies injury to finger including twisting or jamming it. She's had similar swelling in her wrists and knuckles and was told she had arthritis. She denies hx of gout and rheumatoid arthritis. She has tried ibuprofen with minimal relief.    Patient Active Problem List   Diagnosis Date Noted  . Chest pain 11/18/2014  . SKIN RASH 01/06/2010  . ABSCESS, TOOTH 11/04/2009  . TINEA CRURIS 10/22/2009  . FASCIITIS, PLANTAR 10/22/2009  . DENTAL PAIN 04/09/2009  . VITAMIN D DEFICIENCY 01/24/2009  . Hyperlipidemia 01/24/2009  . Essential hypertension, benign 01/24/2009   Past Medical History  Diagnosis Date  . Hypertension   . Hypercholesteremia    History reviewed. No pertinent past surgical history. No Known Allergies Prior to Admission medications   Medication Sig Start Date End Date Taking? Authorizing Provider  aspirin EC 81 MG tablet Take 162 mg by mouth once.   Yes Historical Provider, MD  CALCIUM-VITAMIN D PO Take 1 tablet by mouth daily.   Yes Historical Provider, MD  ibuprofen (ADVIL,MOTRIN) 200 MG tablet Take 400 mg by mouth every 6 (six) hours as needed for moderate pain.   Yes Historical Provider, MD  Ibuprofen-Diphenhydramine Cit (ADVIL PM) 200-38 MG TABS Take 1 tablet by mouth as needed (FOR SLEEP).   Yes Historical Provider, MD  Multiple Vitamin (MULTIVITAMIN WITH MINERALS) TABS tablet Take 1 tablet by mouth daily.   Yes Historical Provider,  MD  olmesartan-hydrochlorothiazide (BENICAR HCT) 40-25 MG per tablet Take 1 tablet by mouth daily.     Yes Historical Provider, MD  rosuvastatin (CRESTOR) 10 MG tablet Take by mouth at bedtime.    Yes Historical Provider, MD  ALPRAZolam (XANAX) 0.25 MG tablet Take 1 tablet (0.25 mg total) by mouth at bedtime as needed (panic attack). Patient not taking: Reported on 02/03/2015 11/18/14   Christiane Haorinna L Sullivan, MD   Social History   Social History  . Marital Status: Widowed    Spouse Name: N/A  . Number of Children: N/A  . Years of Education: N/A   Occupational History  . Not on file.   Social History Main Topics  . Smoking status: Never Smoker   . Smokeless tobacco: Never Used  . Alcohol Use: No  . Drug Use: No  . Sexual Activity: Not on file   Other Topics Concern  . Not on file   Social History Narrative    Review of Systems  Musculoskeletal: Positive for joint swelling. Negative for arthralgias.  Neurological: Negative for weakness and numbness.   Objective:   Physical Exam  Constitutional: She is oriented to person, place, and time. She appears well-developed and well-nourished. No distress.  HENT:  Head: Normocephalic and atraumatic.  Eyes: Conjunctivae and EOM are normal.  Neck: Neck supple.  Cardiovascular: Normal rate, regular rhythm and normal heart sounds.   Pulmonary/Chest: Effort normal and breath sounds  normal. She has no wheezes. She has no rales.  Musculoskeletal: Normal range of motion.  Right hand skin intact. no Erythema or significant warmth. marked swelling of 3rd PIP with slight extension to middle phalanx of dorsum. MCP and DIP is not involved. Flexion of PIP to 90 degrees. Able to extend without difficulty. NVI distally cap refill < 2 sec.   Neurological: She is alert and oriented to person, place, and time.  Skin: Skin is warm and dry.  Psychiatric: She has a normal mood and affect. Her behavior is normal.  Nursing note and vitals reviewed.   Filed  Vitals:   02/03/15 1105  BP: 124/70  Pulse: 68  Temp: 98.5 F (36.9 C)  TempSrc: Oral  Resp: 17  Height: 5' 6.75" (1.695 m)  Weight: 214 lb 9.6 oz (97.342 kg)   [facility] reading (PRIMARY) by Dr. Neva Seat. Right middle finger- 3rd PIP exostosis of the distal aspect proximal phalanx. Decreased joint space. No acute findings.   Assessment & Plan:   Tara Guerra is a 69 y.o. female Finger joint swelling, right - Plan: DG Finger Middle Right, Cyclic Citrul Peptide Antibody, IGG, Uric acid, Rheumatoid factor, Sedimentation rate, ANA, meloxicam (MOBIC) 7.5 MG tablet  Arthropathy - Plan: DG Finger Middle Right, Cyclic Citrul Peptide Antibody, IGG, Uric acid, Rheumatoid factor, Sedimentation rate, ANA, meloxicam (MOBIC) 7.5 MG tablet  Monoarthritis of hand, right - Plan: Cyclic Citrul Peptide Antibody, IGG, Uric acid, Rheumatoid factor, Sedimentation rate, ANA, meloxicam (MOBIC) 7.5 MG tablet  Need for prophylactic vaccination and inoculation against influenza - Plan: Flu Vaccine QUAD 36+ mos IM   isolated PIP swelling, but previous history of wrist swelling , possible  Inflammatory arthritis. We'll check sedimentation rate, anti-CCP, uric acid, RF,  ANA,  Short-term  Mobic for current symptoms, RTC precautions  flu vaccine given.  Meds ordered this encounter  Medications  . meloxicam (MOBIC) 7.5 MG tablet    Sig: Take 1 tablet (7.5 mg total) by mouth daily.    Dispense:  30 tablet    Refill:  0   Patient Instructions  Try the mobic each morning (do not combine with other over the counter pain relievers), You should receive a call or letter about your lab results within the next week to 10 days. Depending on these results - I may be referring you to meet with Rheumatologist to decide on other possible treatments.  Return to the clinic or go to the nearest emergency room if any of your symptoms worsen or new symptoms occur.     By signing my name below, I, Raven Small, attest that this  documentation has been prepared under the direction and in the presence of Meredith Staggers, MD.  Electronically Signed: Andrew Au, ED Scribe. 02/03/2015. 11:50 AM.

## 2015-02-03 NOTE — Patient Instructions (Signed)
Try the mobic each morning (do not combine with other over the counter pain relievers), You should receive a call or letter about your lab results within the next week to 10 days. Depending on these results - I may be referring you to meet with Rheumatologist to decide on other possible treatments.  Return to the clinic or go to the nearest emergency room if any of your symptoms worsen or new symptoms occur.

## 2015-02-04 LAB — ANA: Anti Nuclear Antibody(ANA): NEGATIVE

## 2015-02-04 LAB — CYCLIC CITRUL PEPTIDE ANTIBODY, IGG: Cyclic Citrullin Peptide Ab: <16 Units

## 2015-02-18 DIAGNOSIS — M79643 Pain in unspecified hand: Secondary | ICD-10-CM | POA: Diagnosis not present

## 2015-02-18 DIAGNOSIS — N183 Chronic kidney disease, stage 3 (moderate): Secondary | ICD-10-CM | POA: Diagnosis not present

## 2015-02-18 DIAGNOSIS — I129 Hypertensive chronic kidney disease with stage 1 through stage 4 chronic kidney disease, or unspecified chronic kidney disease: Secondary | ICD-10-CM | POA: Diagnosis not present

## 2015-02-18 DIAGNOSIS — R7309 Other abnormal glucose: Secondary | ICD-10-CM | POA: Diagnosis not present

## 2015-08-07 ENCOUNTER — Other Ambulatory Visit: Payer: Self-pay

## 2015-08-07 DIAGNOSIS — Z1231 Encounter for screening mammogram for malignant neoplasm of breast: Secondary | ICD-10-CM

## 2015-08-08 DIAGNOSIS — I129 Hypertensive chronic kidney disease with stage 1 through stage 4 chronic kidney disease, or unspecified chronic kidney disease: Secondary | ICD-10-CM | POA: Diagnosis not present

## 2015-08-08 DIAGNOSIS — Z79899 Other long term (current) drug therapy: Secondary | ICD-10-CM | POA: Diagnosis not present

## 2015-08-08 DIAGNOSIS — R7303 Prediabetes: Secondary | ICD-10-CM | POA: Diagnosis not present

## 2015-08-08 DIAGNOSIS — N183 Chronic kidney disease, stage 3 (moderate): Secondary | ICD-10-CM | POA: Diagnosis not present

## 2015-08-22 ENCOUNTER — Ambulatory Visit: Payer: Commercial Managed Care - HMO

## 2015-09-05 ENCOUNTER — Ambulatory Visit
Admission: RE | Admit: 2015-09-05 | Discharge: 2015-09-05 | Disposition: A | Discharge status: Home / Self Care | Payer: Commercial Managed Care - HMO | Source: Ambulatory Visit

## 2015-09-05 DIAGNOSIS — Z1231 Encounter for screening mammogram for malignant neoplasm of breast: Secondary | ICD-10-CM

## 2015-12-03 DIAGNOSIS — Z0000 Encounter for general adult medical examination without abnormal findings: Secondary | ICD-10-CM | POA: Diagnosis not present

## 2015-12-03 DIAGNOSIS — E559 Vitamin D deficiency, unspecified: Secondary | ICD-10-CM | POA: Diagnosis not present

## 2015-12-03 DIAGNOSIS — Z23 Encounter for immunization: Secondary | ICD-10-CM | POA: Diagnosis not present

## 2015-12-03 DIAGNOSIS — I129 Hypertensive chronic kidney disease with stage 1 through stage 4 chronic kidney disease, or unspecified chronic kidney disease: Secondary | ICD-10-CM | POA: Diagnosis not present

## 2015-12-03 DIAGNOSIS — R7309 Other abnormal glucose: Secondary | ICD-10-CM | POA: Diagnosis not present

## 2015-12-03 DIAGNOSIS — N183 Chronic kidney disease, stage 3 (moderate): Secondary | ICD-10-CM | POA: Diagnosis not present

## 2016-03-26 DIAGNOSIS — R7309 Other abnormal glucose: Secondary | ICD-10-CM | POA: Diagnosis not present

## 2016-03-26 DIAGNOSIS — I129 Hypertensive chronic kidney disease with stage 1 through stage 4 chronic kidney disease, or unspecified chronic kidney disease: Secondary | ICD-10-CM | POA: Diagnosis not present

## 2016-03-26 DIAGNOSIS — N183 Chronic kidney disease, stage 3 (moderate): Secondary | ICD-10-CM | POA: Diagnosis not present

## 2016-03-26 DIAGNOSIS — M65312 Trigger thumb, left thumb: Secondary | ICD-10-CM | POA: Diagnosis not present

## 2016-03-26 DIAGNOSIS — N951 Menopausal and female climacteric states: Secondary | ICD-10-CM | POA: Diagnosis not present

## 2016-06-24 DIAGNOSIS — M65312 Trigger thumb, left thumb: Secondary | ICD-10-CM | POA: Diagnosis not present

## 2016-06-24 DIAGNOSIS — M79642 Pain in left hand: Secondary | ICD-10-CM | POA: Diagnosis not present

## 2016-07-02 DIAGNOSIS — E669 Obesity, unspecified: Secondary | ICD-10-CM | POA: Diagnosis not present

## 2016-07-02 DIAGNOSIS — Z1211 Encounter for screening for malignant neoplasm of colon: Secondary | ICD-10-CM | POA: Diagnosis not present

## 2016-07-02 DIAGNOSIS — Z86010 Personal history of colonic polyps: Secondary | ICD-10-CM | POA: Diagnosis not present

## 2016-07-02 DIAGNOSIS — K648 Other hemorrhoids: Secondary | ICD-10-CM | POA: Diagnosis not present

## 2016-07-14 DIAGNOSIS — M25562 Pain in left knee: Secondary | ICD-10-CM | POA: Diagnosis not present

## 2016-07-14 DIAGNOSIS — G8929 Other chronic pain: Secondary | ICD-10-CM | POA: Diagnosis not present

## 2016-07-14 DIAGNOSIS — M1712 Unilateral primary osteoarthritis, left knee: Secondary | ICD-10-CM | POA: Diagnosis not present

## 2016-07-14 DIAGNOSIS — M1611 Unilateral primary osteoarthritis, right hip: Secondary | ICD-10-CM | POA: Diagnosis not present

## 2016-07-14 DIAGNOSIS — M25561 Pain in right knee: Secondary | ICD-10-CM | POA: Diagnosis not present

## 2016-08-06 DIAGNOSIS — N183 Chronic kidney disease, stage 3 (moderate): Secondary | ICD-10-CM | POA: Diagnosis not present

## 2016-08-06 DIAGNOSIS — R7309 Other abnormal glucose: Secondary | ICD-10-CM | POA: Diagnosis not present

## 2016-08-06 DIAGNOSIS — I129 Hypertensive chronic kidney disease with stage 1 through stage 4 chronic kidney disease, or unspecified chronic kidney disease: Secondary | ICD-10-CM | POA: Diagnosis not present

## 2016-08-06 DIAGNOSIS — M25552 Pain in left hip: Secondary | ICD-10-CM | POA: Diagnosis not present

## 2016-08-21 DIAGNOSIS — Z1211 Encounter for screening for malignant neoplasm of colon: Secondary | ICD-10-CM | POA: Diagnosis not present

## 2016-08-21 DIAGNOSIS — Z86010 Personal history of colonic polyps: Secondary | ICD-10-CM | POA: Diagnosis not present

## 2016-09-23 ENCOUNTER — Other Ambulatory Visit: Payer: Self-pay | Admitting: Internal Medicine

## 2016-09-23 DIAGNOSIS — Z1231 Encounter for screening mammogram for malignant neoplasm of breast: Secondary | ICD-10-CM

## 2016-10-02 ENCOUNTER — Ambulatory Visit
Admission: RE | Admit: 2016-10-02 | Discharge: 2016-10-02 | Disposition: A | Discharge status: Home / Self Care | Payer: Commercial Managed Care - HMO | Source: Ambulatory Visit | Attending: Internal Medicine | Admitting: Internal Medicine

## 2016-10-02 DIAGNOSIS — Z1231 Encounter for screening mammogram for malignant neoplasm of breast: Secondary | ICD-10-CM

## 2016-10-19 DIAGNOSIS — N183 Chronic kidney disease, stage 3 (moderate): Secondary | ICD-10-CM | POA: Diagnosis not present

## 2016-10-19 DIAGNOSIS — M1611 Unilateral primary osteoarthritis, right hip: Secondary | ICD-10-CM | POA: Diagnosis not present

## 2016-10-19 DIAGNOSIS — Z01818 Encounter for other preprocedural examination: Secondary | ICD-10-CM | POA: Diagnosis not present

## 2016-10-19 DIAGNOSIS — M25551 Pain in right hip: Secondary | ICD-10-CM | POA: Diagnosis not present

## 2016-10-19 DIAGNOSIS — I129 Hypertensive chronic kidney disease with stage 1 through stage 4 chronic kidney disease, or unspecified chronic kidney disease: Secondary | ICD-10-CM | POA: Diagnosis not present

## 2017-02-10 DIAGNOSIS — N183 Chronic kidney disease, stage 3 (moderate): Secondary | ICD-10-CM | POA: Diagnosis not present

## 2017-02-10 DIAGNOSIS — R7309 Other abnormal glucose: Secondary | ICD-10-CM | POA: Diagnosis not present

## 2017-02-10 DIAGNOSIS — I129 Hypertensive chronic kidney disease with stage 1 through stage 4 chronic kidney disease, or unspecified chronic kidney disease: Secondary | ICD-10-CM | POA: Diagnosis not present

## 2017-02-10 DIAGNOSIS — E559 Vitamin D deficiency, unspecified: Secondary | ICD-10-CM | POA: Diagnosis not present

## 2017-03-04 DIAGNOSIS — I129 Hypertensive chronic kidney disease with stage 1 through stage 4 chronic kidney disease, or unspecified chronic kidney disease: Secondary | ICD-10-CM | POA: Diagnosis not present

## 2017-03-04 DIAGNOSIS — N183 Chronic kidney disease, stage 3 (moderate): Secondary | ICD-10-CM | POA: Diagnosis not present

## 2017-03-16 HISTORY — PX: COLONOSCOPY: SHX174

## 2017-04-13 IMAGING — CR DG FINGER MIDDLE 2+V*R*
2 series · 2 of 2 positions shown · non-contrast
Comparison: None.

CLINICAL DATA: Swelling in the third PIP joint. Finger joint
swelling, right. Arthropathy.

EXAM:
RIGHT MIDDLE FINGER 2+V

[PA]
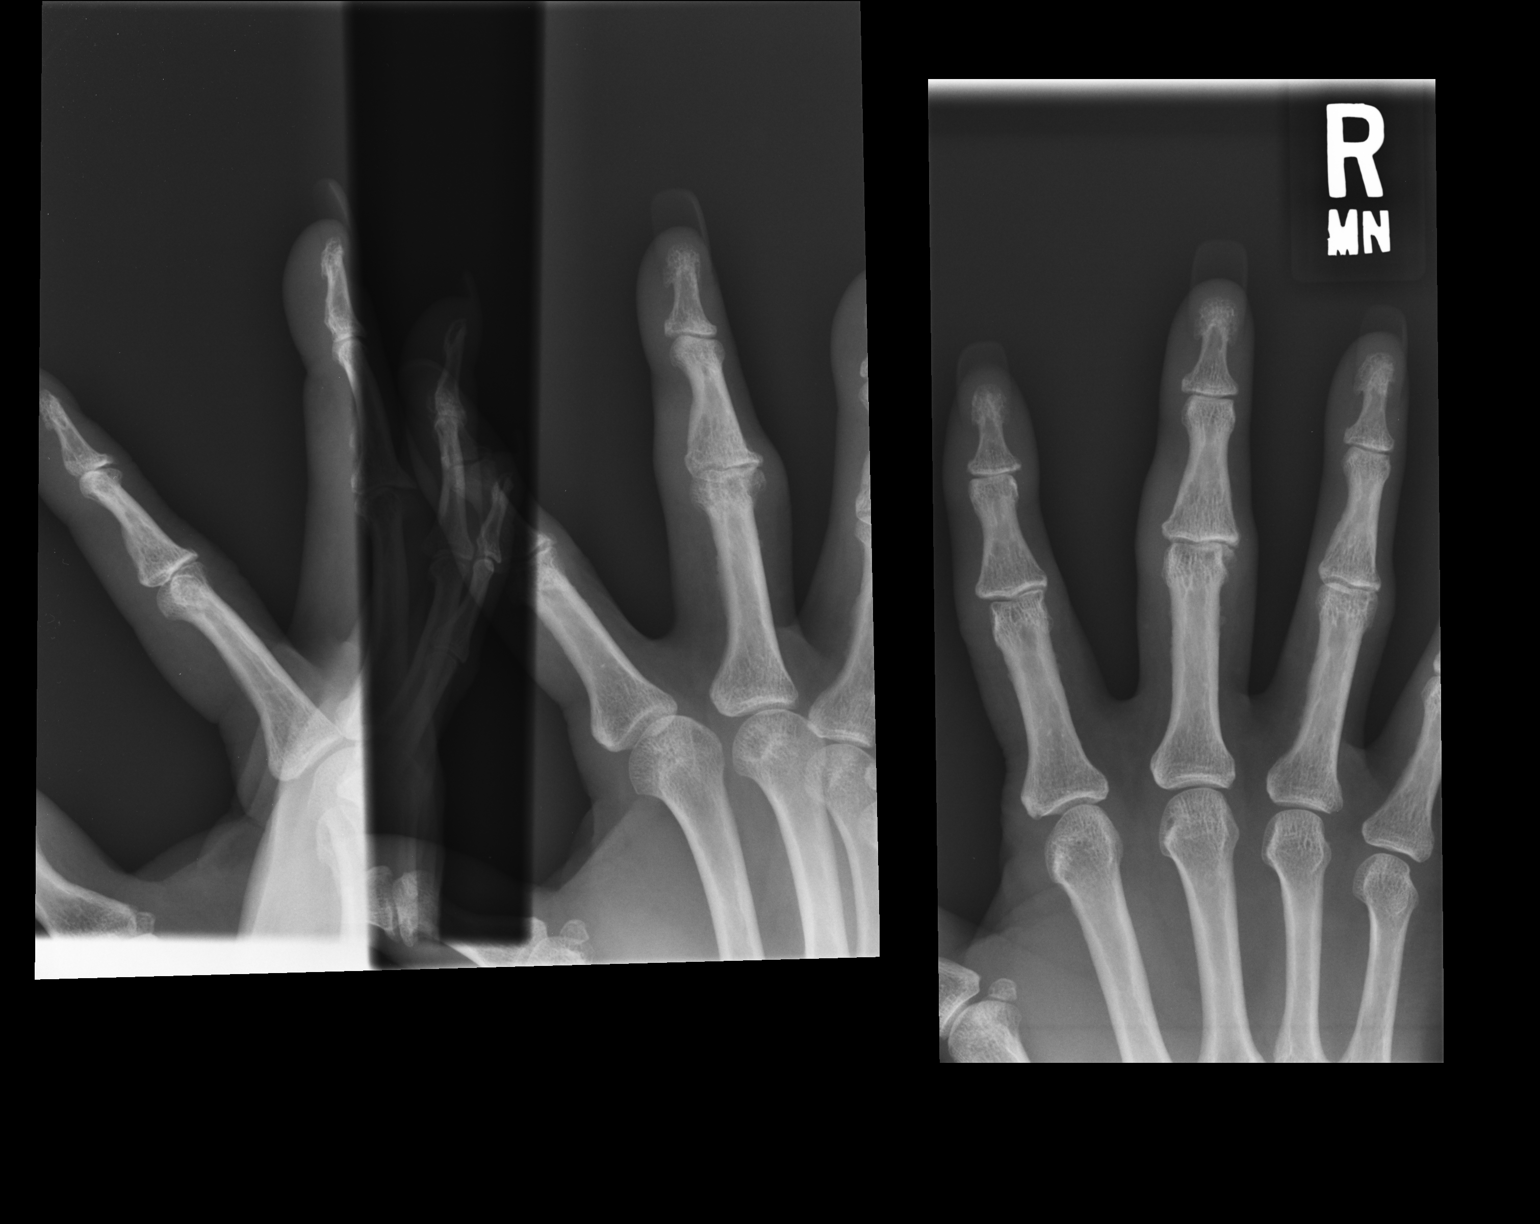

[lateral]
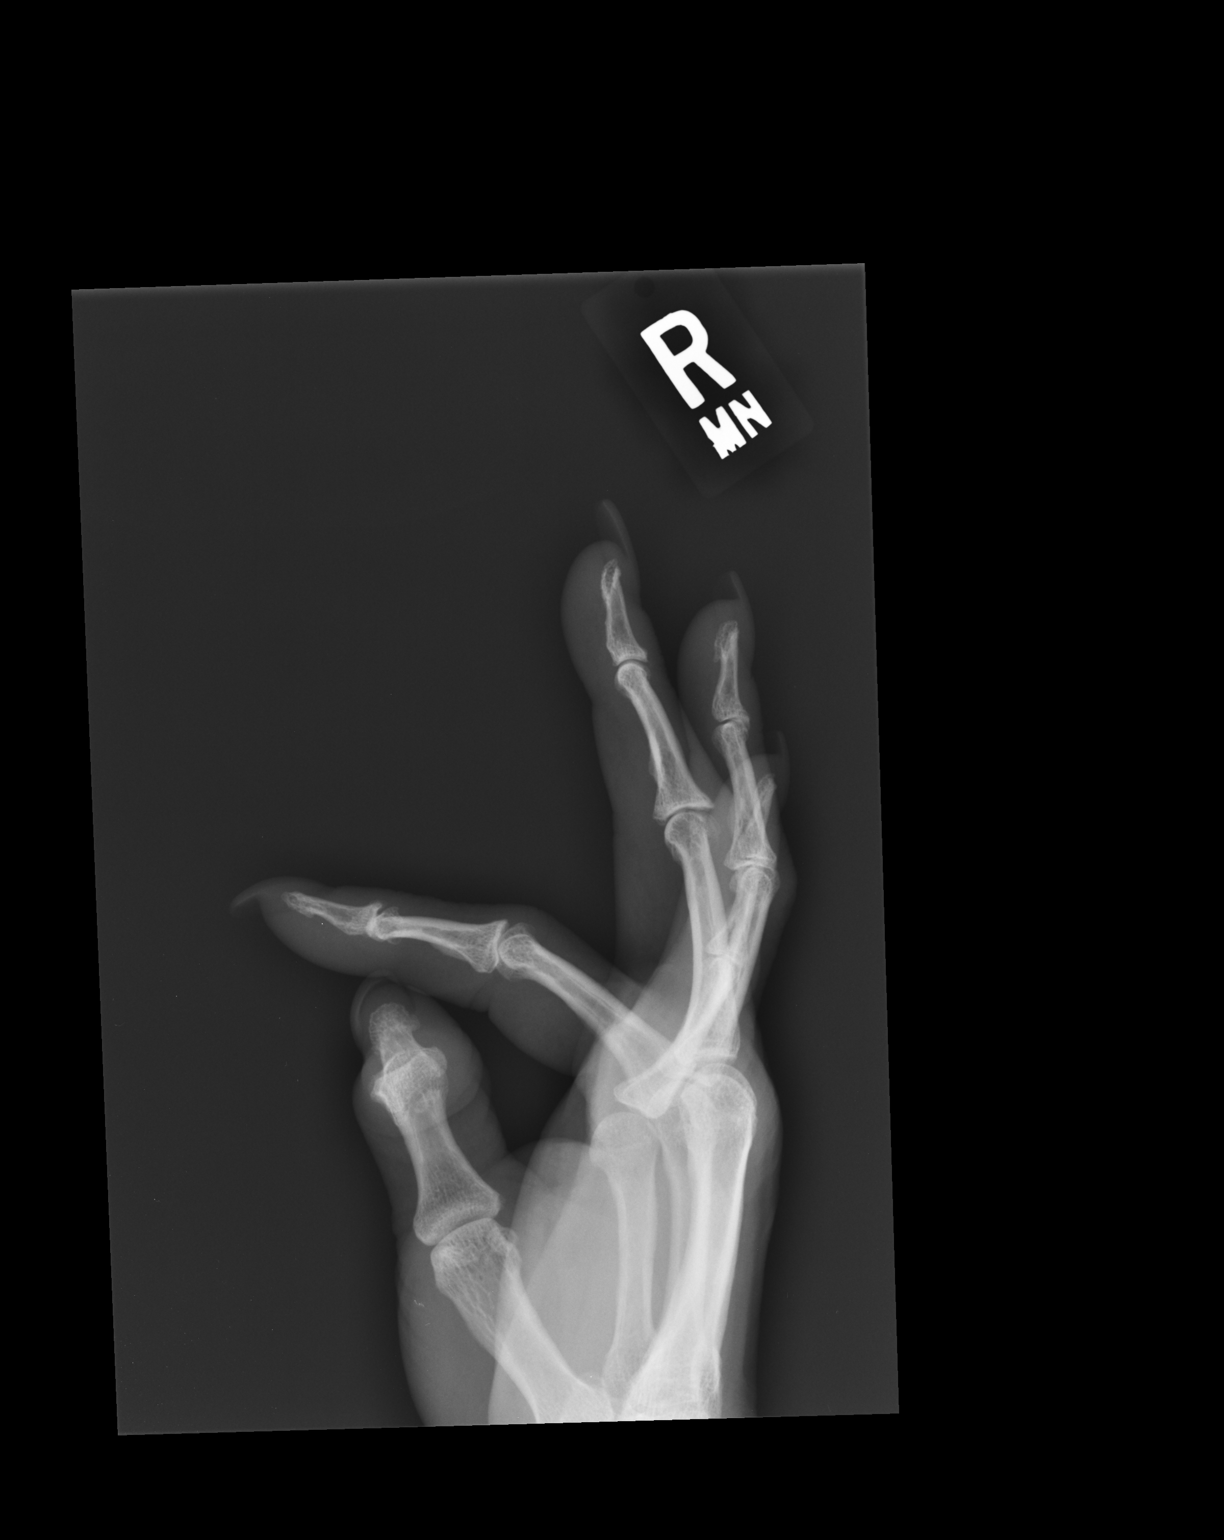

[2 of 2 positions shown; findings below may reference images not displayed]

FINDINGS: Soft tissue swelling at the middle finger PIP joint. There is joint
space narrowing along the ulnar aspect of the middle finger PIP
joint with osteophytosis along the ulnar aspect. Negative for a
fracture or dislocation.
IMPRESSION: Soft tissue swelling in the middle finger PIP joint with asymmetric
joint space narrowing and osteophytosis. Findings are most likely
related to osteoarthritis. Recommend clinical correlation with
regards to an acute infectious or inflammatory process.

No acute bone abnormality.

## 2017-04-21 ENCOUNTER — Ambulatory Visit (INDEPENDENT_AMBULATORY_CARE_PROVIDER_SITE_OTHER): Payer: PPO

## 2017-04-21 ENCOUNTER — Encounter (INDEPENDENT_AMBULATORY_CARE_PROVIDER_SITE_OTHER): Payer: Self-pay | Admitting: Orthopedic Surgery

## 2017-04-21 ENCOUNTER — Ambulatory Visit (INDEPENDENT_AMBULATORY_CARE_PROVIDER_SITE_OTHER): Payer: PPO | Admitting: Orthopedic Surgery

## 2017-04-21 DIAGNOSIS — M25561 Pain in right knee: Secondary | ICD-10-CM

## 2017-04-21 DIAGNOSIS — M1611 Unilateral primary osteoarthritis, right hip: Secondary | ICD-10-CM

## 2017-04-21 DIAGNOSIS — M79604 Pain in right leg: Secondary | ICD-10-CM

## 2017-04-21 DIAGNOSIS — G8929 Other chronic pain: Secondary | ICD-10-CM

## 2017-04-21 NOTE — Progress Notes (Signed)
Office Visit Note   Patient: Tara Guerra           Date of Birth: 06-02-1945           MRN: [id number] Visit Date: 04/21/2017 Requested by: Dorothyann PengSanders, Robyn, MD [id number] [place], [place] [id number] PCP: Dorothyann PengSanders, Robyn, MD  Subjective: Chief Complaint  Patient presents with  . Right Knee - Pain    HPI: Tara RhodesBetty is a 72 year old patient with right leg and knee pain.  [facility] got [facility] gel injection which she states helped "a little".  Pain is been going on for years.  She states she cannot walk very far.  Sometimes the pain will wake her from sleep at night.  She states it really hurts from her buttock down to her knee but not below her knee.  Denies any symptoms below the knee.  Does report burning type pain in the leg.  She is been using Voltaren gel which helps some as well as tramadol.              ROS: All systems reviewed are negative as they relate to the chief complaint within the history of present illness.  Patient denies  fevers or chills.   Assessment & Plan: Visit Diagnoses:  1. Chronic pain of right knee   2. Pain in right leg   3. Unilateral primary osteoarthritis, right hip     Plan: Impression is fairly normal right knee examination and radiographs.  Patient does have end stage arthritis in the right hip along with Trendelenburg gait which is accounting for all of her pain.  We talked about options for treatment of this problem.  Basically it is living with it and taking medication versus hip replacement.  For the amount of arthritis in her hip joint I do not think injections are indicated because they would not give her longer than 1-2 days of relief.  She will consider her options and call me back and come in if she wants to proceed with hip replacement.  Follow-Up Instructions: No Follow-up on file.   Orders:  Orders Placed This Encounter  Procedures  . XR KNEE 3 VIEW RIGHT  . XR Lumbar Spine 2-3 Views  . XR HIP  UNILAT W OR W/O PELVIS 2-3 VIEWS RIGHT   No orders of the defined types were placed in this encounter.     Procedures: No procedures performed   Clinical Data: No additional findings.  Objective: Vital Signs: There were no vitals taken for this visit.  Physical Exam:   Constitutional: Patient appears well-developed HEENT:  Head: Normocephalic Eyes:EOM are normal Neck: Normal range of motion Cardiovascular: Normal rate Pulmonary/chest: Effort normal Neurologic: Patient is alert Skin: Skin is warm Psychiatric: Patient has normal mood and affect    Ortho Exam: Orthopedic exam demonstrates antalgic Trendelenburg gait to the right.  Leg lengths approximately equal.  Pedal pulses palpable.  Ankle dorsiflexion plantarflexion quad hamstring strength intact.  Patient has pain with attempted internal/external rotation of the right hip but not the left hip.  Hip flexion strength intact.  Specialty Comments:  No specialty comments available.  Imaging: Xr Hip Unilat W Or W/o Pelvis 2-3 Views Right  Result Date: 04/21/2017 AP pelvis lateral right hip reviewed.  End-stage bone-on-bone arthritis with sclerosis and cystic formation is noted within the right hip joint.  Left hip joint is preserved.  Remainder bony pelvis normal  Xr Knee 3 View Right  Result Date: 04/21/2017 AP lateral merchant right knee reviewed.  Mild medial joint space narrowing is present.  No fracture or dislocation is seen.  No effusion in the knee.  The patella well centered in the trochlear groove.  Xr Lumbar Spine 2-3 Views  Result Date: 04/21/2017 AP lateral lumbar spine reviewed.  No spondylolisthesis or compression fractures.  Disc spaces between the lumbar vertebral bodies well maintained.  Minimal to no facet arthritis present.  SI joints intact.    PMFS History: Patient Active Problem List   Diagnosis Date Noted  . Chest pain 11/18/2014  . SKIN RASH 01/06/2010  . ABSCESS, TOOTH 11/04/2009  . TINEA  CRURIS 10/22/2009  . FASCIITIS, PLANTAR 10/22/2009  . DENTAL PAIN 04/09/2009  . VITAMIN D DEFICIENCY 01/24/2009  . Hyperlipidemia 01/24/2009  . Essential hypertension, benign 01/24/2009   Past Medical History:  Diagnosis Date  . Hypercholesteremia   . Hypertension     Family History  Problem Relation Age of Onset  . Alzheimer's disease Mother   . Heart attack Father     History reviewed. No pertinent surgical history. Social History   Occupational History  . Not on file  Tobacco Use  . Smoking status: Never Smoker  . Smokeless tobacco: Never Used  Substance and Sexual Activity  . Alcohol use: No  . Drug use: No  . Sexual activity: Not on file

## 2017-05-13 DIAGNOSIS — I129 Hypertensive chronic kidney disease with stage 1 through stage 4 chronic kidney disease, or unspecified chronic kidney disease: Secondary | ICD-10-CM | POA: Diagnosis not present

## 2017-05-13 DIAGNOSIS — R7309 Other abnormal glucose: Secondary | ICD-10-CM | POA: Diagnosis not present

## 2017-05-13 DIAGNOSIS — N183 Chronic kidney disease, stage 3 (moderate): Secondary | ICD-10-CM | POA: Diagnosis not present

## 2017-05-13 DIAGNOSIS — E2839 Other primary ovarian failure: Secondary | ICD-10-CM | POA: Diagnosis not present

## 2017-05-24 ENCOUNTER — Other Ambulatory Visit: Payer: Self-pay | Admitting: Internal Medicine

## 2017-05-24 DIAGNOSIS — Z139 Encounter for screening, unspecified: Secondary | ICD-10-CM

## 2017-05-24 DIAGNOSIS — E2839 Other primary ovarian failure: Secondary | ICD-10-CM

## 2017-09-13 DIAGNOSIS — R7309 Other abnormal glucose: Secondary | ICD-10-CM | POA: Diagnosis not present

## 2017-09-13 DIAGNOSIS — E559 Vitamin D deficiency, unspecified: Secondary | ICD-10-CM | POA: Diagnosis not present

## 2017-09-13 DIAGNOSIS — Z0000 Encounter for general adult medical examination without abnormal findings: Secondary | ICD-10-CM | POA: Diagnosis not present

## 2017-09-13 DIAGNOSIS — E785 Hyperlipidemia, unspecified: Secondary | ICD-10-CM | POA: Diagnosis not present

## 2017-09-13 DIAGNOSIS — I129 Hypertensive chronic kidney disease with stage 1 through stage 4 chronic kidney disease, or unspecified chronic kidney disease: Secondary | ICD-10-CM | POA: Diagnosis not present

## 2017-09-13 DIAGNOSIS — N183 Chronic kidney disease, stage 3 (moderate): Secondary | ICD-10-CM | POA: Diagnosis not present

## 2018-01-05 ENCOUNTER — Ambulatory Visit
Admission: RE | Admit: 2018-01-05 | Discharge: 2018-01-05 | Disposition: A | Discharge status: Home / Self Care | Payer: PPO | Source: Ambulatory Visit | Attending: Internal Medicine | Admitting: Internal Medicine

## 2018-01-05 DIAGNOSIS — Z1231 Encounter for screening mammogram for malignant neoplasm of breast: Secondary | ICD-10-CM | POA: Diagnosis not present

## 2018-01-05 DIAGNOSIS — Z139 Encounter for screening, unspecified: Secondary | ICD-10-CM

## 2018-01-09 ENCOUNTER — Other Ambulatory Visit: Payer: Self-pay | Admitting: Internal Medicine

## 2018-01-11 NOTE — Telephone Encounter (Signed)
Tramadol refill

## 2018-01-27 ENCOUNTER — Other Ambulatory Visit: Payer: Self-pay | Admitting: Internal Medicine

## 2018-01-28 NOTE — Telephone Encounter (Signed)
Tramadol refill

## 2018-02-22 DIAGNOSIS — H5203 Hypermetropia, bilateral: Secondary | ICD-10-CM | POA: Diagnosis not present

## 2018-02-22 DIAGNOSIS — H52223 Regular astigmatism, bilateral: Secondary | ICD-10-CM | POA: Diagnosis not present

## 2018-02-22 DIAGNOSIS — H40013 Open angle with borderline findings, low risk, bilateral: Secondary | ICD-10-CM | POA: Diagnosis not present

## 2018-02-22 DIAGNOSIS — H2513 Age-related nuclear cataract, bilateral: Secondary | ICD-10-CM | POA: Diagnosis not present

## 2018-03-07 ENCOUNTER — Ambulatory Visit: Payer: PPO | Admitting: Internal Medicine

## 2018-03-22 ENCOUNTER — Telehealth: Payer: Self-pay | Admitting: Internal Medicine

## 2018-03-22 NOTE — Telephone Encounter (Signed)
[name], daughter of patient returned my previous call.  Spoke directly with patient Tara Guerra to schedule AWV.  While on the phone, patient states she has an updated home phone number of [phone].  Just want to let front office staff know to update, please.- LEC.

## 2018-03-22 NOTE — Telephone Encounter (Signed)
Called home phone number [phone].  Gentleman answered states wrong phone number.  Need to schedule AWV-S.  Last AWV 03/04/17. lec Called Tara Guerra, daughter at [phone].  Tara Guerra on [facility] 04/21/17.  No answer, left message. lec Tara Guerra, patient's daughter returned my call.  Ms. Tara Guerra let me speak with Ms. Tara Guerra, patient.  Patient scheduled AWV-S for 03/30/2018 at 3:45 pm. lec

## 2018-03-30 ENCOUNTER — Other Ambulatory Visit: Payer: Self-pay | Admitting: Nurse Practitioner

## 2018-03-30 ENCOUNTER — Ambulatory Visit (INDEPENDENT_AMBULATORY_CARE_PROVIDER_SITE_OTHER): Payer: PPO | Admitting: Nurse Practitioner

## 2018-03-30 ENCOUNTER — Ambulatory Visit (INDEPENDENT_AMBULATORY_CARE_PROVIDER_SITE_OTHER): Payer: PPO

## 2018-03-30 ENCOUNTER — Encounter: Payer: Self-pay | Admitting: Nurse Practitioner

## 2018-03-30 VITALS — BP 142/90 | HR 85 | Temp 98.2°F | Ht 65.0 in | Wt 215.4 lb

## 2018-03-30 DIAGNOSIS — Z79899 Other long term (current) drug therapy: Secondary | ICD-10-CM | POA: Diagnosis not present

## 2018-03-30 DIAGNOSIS — M25551 Pain in right hip: Secondary | ICD-10-CM

## 2018-03-30 DIAGNOSIS — I10 Essential (primary) hypertension: Secondary | ICD-10-CM

## 2018-03-30 DIAGNOSIS — K219 Gastro-esophageal reflux disease without esophagitis: Secondary | ICD-10-CM

## 2018-03-30 DIAGNOSIS — R7303 Prediabetes: Secondary | ICD-10-CM | POA: Diagnosis not present

## 2018-03-30 DIAGNOSIS — E782 Mixed hyperlipidemia: Secondary | ICD-10-CM | POA: Diagnosis not present

## 2018-03-30 DIAGNOSIS — Z0000 Encounter for general adult medical examination without abnormal findings: Secondary | ICD-10-CM

## 2018-03-30 LAB — POCT URINALYSIS DIPSTICK
Bilirubin, UA: NEGATIVE
Glucose, UA: NEGATIVE
KETONES UA: NEGATIVE
NITRITE UA: NEGATIVE
PROTEIN UA: NEGATIVE
Spec Grav, UA: 1.015 (ref 1.010–1.025)
Urobilinogen, UA: 0.2 E.U./dL
pH, UA: 6 (ref 5.0–8.0)

## 2018-03-30 LAB — POCT UA - MICROALBUMIN
CREATININE, POC: 100 mg/dL
Microalbumin Ur, POC: 30 mg/L

## 2018-03-30 MED ORDER — OMEPRAZOLE 10 MG PO CPDR: 10.0000 mg | DELAYED_RELEASE_CAPSULE | Freq: Every day | ORAL | 1 refills | Status: DC

## 2018-03-30 NOTE — Progress Notes (Signed)
Subjective:   Tara Guerra is a 73 y.o. female who presents for Medicare Annual (Subsequent) preventive examination.  Review of Systems:  n/a Cardiac Risk Factors include: advanced age (>53men, >73 women);dyslipidemia;hypertension;obesity (BMI >30kg/m2)     Objective:     Vitals: BP (!) 142/90 (BP Location: Left Arm, Patient Position: Sitting)   Pulse 85   Temp 98.2 F (36.8 C) (Oral)   Ht 5\' 5"  (1.651 m)   Wt 215 lb 6.4 oz (97.7 kg)   SpO2 93%   BMI 35.84 kg/m   Body mass index is 35.84 kg/m.  Advanced Directives 03/30/2018 11/18/2014  Does Patient Have a Medical Advance Directive? Yes No  Does patient want to make changes to medical advance directive? No - Patient declined -  Would patient like information on creating a medical advance directive? - Yes English as a second language teacher given;Yes - Spiritual care consult ordered    Tobacco Social History   Tobacco Use  Smoking Status Never Smoker  Smokeless Tobacco Never Used     Counseling given: Not Answered   Clinical Intake:  Pre-visit preparation completed: Yes  Pain : 0-10 Pain Score: 7  Pain Type: Chronic pain Pain Location: Hip(right) Pain Orientation: Right Pain Radiating Towards: down to right knee Pain Descriptors / Indicators: Aching Pain Onset: More than a month ago Pain Frequency: (with movement) Pain Relieving Factors: rest Effect of Pain on Daily Activities: slows down  Pain Relieving Factors: rest  Nutritional Status: BMI > 30  Obese Nutritional Risks: None Diabetes: No  How often do you need to have someone help you when you read instructions, pamphlets, or other written materials from your doctor or pharmacy?: 1 - Never What is the last grade level you completed in school?: 11 th grade  Interpreter Needed?: No  Information entered by :: NAllen LPN  Past Medical History:  Diagnosis Date  . Hypercholesteremia   . Hypertension    History reviewed. No pertinent surgical  history. Family History  Problem Relation Age of Onset  . Alzheimer's disease Mother   . Heart attack Father    Social History   Socioeconomic History  . Marital status: Widowed    Spouse name: Not on file  . Number of children: Not on file  . Years of education: Not on file  . Highest education level: Not on file  Occupational History  . Not on file  Social Needs  . Financial resource strain: Not hard at all  . Food insecurity:    Worry: Never true    Inability: Never true  . Transportation needs:    Medical: No    Non-medical: No  Tobacco Use  . Smoking status: Never Smoker  . Smokeless tobacco: Never Used  Substance and Sexual Activity  . Alcohol use: No  . Drug use: No  . Sexual activity: Not Currently  Lifestyle  . Physical activity:    Days per week: 2 days    Minutes per session: 10 min  . Stress: Not on file  Relationships  . Social connections:    Talks on phone: Not on file    Gets together: Not on file    Attends religious service: Not on file    Active member of club or organization: Not on file    Attends meetings of clubs or organizations: Not on file    Relationship status: Not on file  Other Topics Concern  . Not on file  Social History Narrative  . Not on file  Outpatient Encounter Medications as of 03/30/2018  Medication Sig  . CALCIUM-VITAMIN D PO Take 1 tablet by mouth daily.  . Multiple Vitamin (MULTIVITAMIN WITH MINERALS) TABS tablet Take 1 tablet by mouth daily.  Marland Kitchen olmesartan-hydrochlorothiazide (BENICAR HCT) 40-25 MG per tablet Take 1 tablet by mouth daily.    . rosuvastatin (CRESTOR) 10 MG tablet Take by mouth at bedtime.   . traMADol (ULTRAM) 50 MG tablet TAKE 1 TABLET BY MOUTH EVERY 6 HOURS AS NEEDED  . aspirin EC 81 MG tablet Take 162 mg by mouth once.  Marland Kitchen ibuprofen (ADVIL,MOTRIN) 200 MG tablet Take 400 mg by mouth every 6 (six) hours as needed for moderate pain.  . Ibuprofen-Diphenhydramine Cit (ADVIL PM) 200-38 MG TABS Take 1  tablet by mouth as needed (FOR SLEEP).  . meloxicam (MOBIC) 7.5 MG tablet Take 1 tablet (7.5 mg total) by mouth daily. (Patient not taking: Reported on 03/30/2018)   No facility-administered encounter medications on file as of 03/30/2018.     Activities of Daily Living In your present state of health, do you have any difficulty performing the following activities: 03/30/2018  Hearing? N  Vision? N  Difficulty concentrating or making decisions? N  Walking or climbing stairs? Y  Comment due to hip  Dressing or bathing? N  Doing errands, shopping? N  Preparing Food and eating ? N  Using the Toilet? N  In the past six months, have you accidently leaked urine? N  Do you have problems with loss of bowel control? N  Managing your Medications? N  Managing your Finances? N  Housekeeping or managing your Housekeeping? N  Some recent data might be hidden    Patient Care Team: Dorothyann Peng, MD as PCP - General (Internal Medicine)    Assessment:   This is a routine wellness examination for Tara Guerra.  Exercise Activities and Dietary recommendations Current Exercise Habits: Home exercise routine, Type of exercise: stretching;walking, Time (Minutes): 10, Frequency (Times/Week): 2, Weekly Exercise (Minutes/Week): 20, Intensity: Mild, Exercise limited by: None identified  Goals    . DIET - INCREASE WATER INTAKE (pt-stated)     Eat healthy       Fall Risk Fall Risk  03/30/2018  Falls in the past year? 0  Risk for fall due to : Medication side effect  Follow up Education provided;Falls prevention discussed   Is the patient's home free of loose throw rugs in walkways, pet beds, electrical cords, etc?   yes      Grab bars in the bathroom? yes      Handrails on the stairs?   yes      Adequate lighting?   yes  Timed Get Up and Go performed: n/a  Depression Screen PHQ 2/9 Scores 03/30/2018 02/03/2015  PHQ - 2 Score 1 0  PHQ- 9 Score 2 -     Cognitive Function     6CIT Screen 03/30/2018   What Year? 0 points  What month? 0 points  What time? 0 points  Count back from 20 0 points  Months in reverse 2 points  Repeat phrase 4 points  Total Score 6    Immunization History  Administered Date(s) Administered  . Influenza Whole 12/15/2007, 01/24/2009, 01/09/2010  . Influenza,inj,Quad PF,6+ Mos 02/03/2015  . Td 12/15/2007    Qualifies for Shingles Vaccine? yes  Screening Tests Health Maintenance  Topic Date Due  . DEXA SCAN  09/13/2010  . Hepatitis C Screening  03/31/2019 (Originally [birth year])  . PNA vac Low Risk Adult (  2 of 2 - PPSV23) 03/31/2019 (Originally 08/22/2014)  . MAMMOGRAM  01/06/2020  . TETANUS/TDAP  07/14/2021  . COLONOSCOPY  08/22/2026  . INFLUENZA VACCINE  Completed    Cancer Screenings: Lung: Low Dose CT Chest recommended if Age 3-80 years, 30 pack-year currently smoking OR have quit w/in 15years. Patient does not qualify. Breast:  Up to date on Mammogram? Yes   Up to date of Bone Density/Dexa? Yes Colorectal: up to date  Additional Screenings: : Hepatitis C Screening: due, but declines     Plan:    Declined HepC screening and PPV23.  Wants to eat healthy and increase water.   I have personally reviewed and noted the following in the patient's chart:   . Medical and social history . Use of alcohol, tobacco or illicit drugs  . Current medications and supplements . Functional ability and status . Nutritional status . Physical activity . Advanced directives . List of other physicians . Hospitalizations, surgeries, and ER visits in previous 12 months . Vitals . Screenings to include cognitive, depression, and falls . Referrals and appointments  In addition, I have reviewed and discussed with patient certain preventive protocols, quality metrics, and best practice recommendations. A written personalized care plan for preventive services as well as general preventive health recommendations were provided to patient.     Barb Merinoickeah E Allen,  LPN  8/11/91471/15/2020

## 2018-03-30 NOTE — Progress Notes (Signed)
Subjective:     Patient ID: Tara Guerra , female    DOB: [birth year] , 73 y.o.   MRN: [id number]   Chief Complaint  Patient presents with  . Hypertension    HPI  Hypertension  This is a chronic problem. The current episode started more than 1 year ago. The problem is unchanged. The problem is uncontrolled. Pertinent negatives include no anxiety, chest pain, headaches, palpitations or shortness of breath. There are no associated agents to hypertension. There are no known risk factors for coronary artery disease. Past treatments include diuretics and angiotensin blockers (forgot to take her blood pressure medications this morning). There are no compliance problems.  There is no history of angina. There is no history of chronic renal disease.  Gastroesophageal Reflux  She complains of dysphagia. She reports no chest pain, no coughing or no sore throat. This is a new problem. The current episode started 1 to 4 weeks ago. The problem occurs occasionally. The problem has been waxing and waning. Nothing (drinks cold water) aggravates the symptoms. Pertinent negatives include no fatigue. Risk factors include obesity. Treatments tried: cold water. The treatment provided significant relief.     Past Medical History:  Diagnosis Date  . Hypercholesteremia   . Hypertension      Family History  Problem Relation Age of Onset  . Alzheimer's disease Mother   . Heart attack Father      Current Outpatient Medications:  .  aspirin EC 81 MG tablet, Take 162 mg by mouth once., Disp: , Rfl:  .  CALCIUM-VITAMIN D PO, Take 1 tablet by mouth daily., Disp: , Rfl:  .  ibuprofen (ADVIL,MOTRIN) 200 MG tablet, Take 400 mg by mouth every 6 (six) hours as needed for moderate pain., Disp: , Rfl:  .  Ibuprofen-Diphenhydramine Cit (ADVIL PM) 200-38 MG TABS, Take 1 tablet by mouth as needed (FOR SLEEP)., Disp: , Rfl:  .  meloxicam (MOBIC) 7.5 MG tablet, Take 1 tablet (7.5 mg total) by mouth daily. (Patient not  taking: Reported on 03/30/2018), Disp: 30 tablet, Rfl: 0 .  Multiple Vitamin (MULTIVITAMIN WITH MINERALS) TABS tablet, Take 1 tablet by mouth daily., Disp: , Rfl:  .  olmesartan-hydrochlorothiazide (BENICAR HCT) 40-25 MG per tablet, Take 1 tablet by mouth daily.  , Disp: , Rfl:  .  rosuvastatin (CRESTOR) 10 MG tablet, Take by mouth at bedtime. , Disp: , Rfl:  .  traMADol (ULTRAM) 50 MG tablet, TAKE 1 TABLET BY MOUTH EVERY 6 HOURS AS NEEDED, Disp: 30 tablet, Rfl: 0   No Known Allergies   Review of Systems  Constitutional: Negative.  Negative for fatigue.  HENT: Negative for sore throat.   Eyes: Negative for visual disturbance.  Respiratory: Negative.  Negative for cough and shortness of breath.   Cardiovascular: Negative.  Negative for chest pain, palpitations and leg swelling.  Gastrointestinal: Positive for dysphagia.  Endocrine: Negative.   Musculoskeletal: Negative.   Skin: Negative.   Neurological: Negative for dizziness, weakness and headaches.  Psychiatric/Behavioral: Negative for confusion. The patient is not nervous/anxious.      Today's Vitals   03/30/18 1523  BP: (!) 142/90  Pulse: 85  Temp: 98.2 F (36.8 C)  TempSrc: Oral  SpO2: 93%  Weight: 215 lb 6.4 oz (97.7 kg)  Height: 5\' 5"  (1.651 m)  PainSc: 0-No pain   Body mass index is 35.84 kg/m.   Objective:  Physical Exam Vitals signs reviewed.  Constitutional:      Appearance: She is well-developed.  HENT:     Head: Normocephalic and atraumatic.     Right Ear: Hearing, tympanic membrane, ear canal and external ear normal.     Left Ear: Hearing, tympanic membrane, ear canal and external ear normal.     Nose: Nose normal.  Eyes:     General: Lids are normal.     Conjunctiva/sclera: Conjunctivae normal.     Pupils: Pupils are equal, round, and reactive to light.     Funduscopic exam:    Right eye: No papilledema.        Left eye: No papilledema.  Neck:     Musculoskeletal: Full passive range of motion  without pain, normal range of motion and neck supple.     Thyroid: No thyroid mass.     Vascular: No carotid bruit.  Cardiovascular:     Rate and Rhythm: Normal rate and regular rhythm.     Pulses: Normal pulses.     Heart sounds: Normal heart sounds. No murmur.  Pulmonary:     Effort: Pulmonary effort is normal.     Breath sounds: Normal breath sounds.  Abdominal:     General: Bowel sounds are normal.     Palpations: Abdomen is soft.  Musculoskeletal: Normal range of motion.  Skin:    General: Skin is warm and dry.     Capillary Refill: Capillary refill takes less than 2 seconds.  Neurological:     Mental Status: She is alert and oriented to person, place, and time.     Cranial Nerves: No cranial nerve deficit.     Sensory: No sensory deficit.  Psychiatric:        Behavior: Behavior normal.        Thought Content: Thought content normal.        Judgment: Judgment normal.         Assessment And Plan:     1. Essential hypertension, benign B/P is poorly controlled today however not going to make any changes to medications at this time Encouraged to drink adequate amounts of water  CMP ordered to check renal function.  The importance of regular exercise and dietary modification was stressed to the patient.  Stressed importance of losing ten percent of her body weight to help with B/P control.  The weight loss would help with decreasing cardiac and cancer risk as well.    - EKG 12-Lead - POCT Urinalysis Dipstick (81002) - POCT UA - Microalbumin - CMP14 + Anion Gap  2. Right hip pain  No abnormal findings on physical exam  I have encouraged her to take tylenol as needed for pain   If persist return call and will consider sending for hip xray  3. Gastroesophageal reflux disease without esophagitis  Can be causing the dysphagia  Will try omeprazole to see if effective - omeprazole (PRILOSEC) 10 MG capsule; Take 1 capsule (10 mg total) by mouth daily.  Dispense: 30  capsule; Refill: 1  4. Prediabetes  Chronic, controlled  Continue with current medications  Encouraged to limit intake of sugary foods and drinks  Encouraged to increase physical activity to 150 minutes per week - CBC - Hemoglobin A1c  5. Other long term (current) drug therapy  - TSH  6. Mixed hyperlipidemia  Chronic, controlled  Continue with current medications  Denies muscle weakness or cramps - Lipid panel     Arnette FeltsJanece Jaqueline Uber, FNP

## 2018-03-30 NOTE — Patient Instructions (Signed)
Tara Guerra , Thank you for taking time to come for your Medicare Wellness Visit. I appreciate your ongoing commitment to your health goals. Please review the following plan we discussed and let me know if I can assist you in the future.   Screening recommendations/referrals: Colonoscopy: 08/2016 Mammogram: 12/2017 Bone Density: 03/2016 Recommended yearly ophthalmology/optometry visit for glaucoma screening and checkup Recommended yearly dental visit for hygiene and checkup  Vaccinations: Influenza vaccine: 12/2017 Pneumococcal vaccine: declined Tdap vaccine: 07/2011 Shingles vaccine: declined    Advanced directives: Please bring a copy of your POA (Power of [place]) and/or Living Will to your next appointment.    Conditions/risks identified: Obesity  Next appointment: 09/19/2018 at 2:15p   Preventive Care 65 Years and Older, Female Preventive care refers to lifestyle choices and visits with your health care provider that can promote health and wellness. What does preventive care include?  A yearly physical exam. This is also called an annual well check.  Dental exams once or twice a year.  Routine eye exams. Ask your health care provider how often you should have your eyes checked.  Personal lifestyle choices, including:  Daily care of your teeth and gums.  Regular physical activity.  Eating a healthy diet.  Avoiding tobacco and drug use.  Limiting alcohol use.  Practicing safe sex.  Taking low-dose aspirin every day.  Taking vitamin and mineral supplements as recommended by your health care provider. What happens during an annual well check? The services and screenings done by your health care provider during your annual well check will depend on your age, overall health, lifestyle risk factors, and family history of disease. Counseling  Your health care provider may ask you questions about your:  Alcohol use.  Tobacco use.  Drug use.  Emotional  well-being.  Home and relationship well-being.  Sexual activity.  Eating habits.  History of falls.  Memory and ability to understand (cognition).  Work and work Astronomer.  Reproductive health. Screening  You may have the following tests or measurements:  Height, weight, and BMI.  Blood pressure.  Lipid and cholesterol levels. These may be checked every 5 years, or more frequently if you are over 58 years old.  Skin check.  Lung cancer screening. You may have this screening every year starting at age 40 if you have a 30-pack-year history of smoking and currently smoke or have quit within the past 15 years.  Fecal occult blood test (FOBT) of the stool. You may have this test every year starting at age 61.  Flexible sigmoidoscopy or colonoscopy. You may have a sigmoidoscopy every 5 years or a colonoscopy every 10 years starting at age 12.  Hepatitis C blood test.  Hepatitis B blood test.  Sexually transmitted disease (STD) testing.  Diabetes screening. This is done by checking your blood sugar (glucose) after you have not eaten for a while (fasting). You may have this done every 1-3 years.  Bone density scan. This is done to screen for osteoporosis. You may have this done starting at age 67.  Mammogram. This may be done every 1-2 years. Talk to your health care provider about how often you should have regular mammograms. Talk with your health care provider about your test results, treatment options, and if necessary, the need for more tests. Vaccines  Your health care provider may recommend certain vaccines, such as:  Influenza vaccine. This is recommended every year.  Tetanus, diphtheria, and acellular pertussis (Tdap, Td) vaccine. You may need a Td booster  every 10 years.  Zoster vaccine. You may need this after age 80.  Pneumococcal 13-valent conjugate (PCV13) vaccine. One dose is recommended after age 88.  Pneumococcal polysaccharide (PPSV23) vaccine. One  dose is recommended after age 62. Talk to your health care provider about which screenings and vaccines you need and how often you need them. This information is not intended to replace advice given to you by your health care provider. Make sure you discuss any questions you have with your health care provider. Document Released: 03/29/2015 Document Revised: 11/20/2015 Document Reviewed: 01/01/2015 Elsevier Interactive Patient Education  2017 [place] Prevention in the Home Falls can cause injuries. They can happen to people of all ages. There are many things you can do to make your home safe and to help prevent falls. What can I do on the outside of my home?  Regularly fix the edges of walkways and driveways and fix any cracks.  Remove anything that might make you trip as you walk through a door, such as a raised step or threshold.  Trim any bushes or trees on the path to your home.  Use bright outdoor lighting.  Clear any walking paths of anything that might make someone trip, such as rocks or tools.  Regularly check to see if handrails are loose or broken. Make sure that both sides of any steps have handrails.  Any raised decks and porches should have guardrails on the edges.  Have any leaves, snow, or ice cleared regularly.  Use sand or salt on walking paths during winter.  Clean up any spills in your garage right away. This includes oil or grease spills. What can I do in the bathroom?  Use night lights.  Install grab bars by the toilet and in the tub and shower. Do not use towel bars as grab bars.  Use non-skid mats or decals in the tub or shower.  If you need to sit down in the shower, use a plastic, non-slip stool.  Keep the floor dry. Clean up any water that spills on the floor as soon as it happens.  Remove soap buildup in the tub or shower regularly.  Attach bath mats securely with double-sided non-slip rug tape.  Do not have throw rugs and other  things on the floor that can make you trip. What can I do in the bedroom?  Use night lights.  Make sure that you have a light by your bed that is easy to reach.  Do not use any sheets or blankets that are too big for your bed. They should not hang down onto the floor.  Have a firm chair that has side arms. You can use this for support while you get dressed.  Do not have throw rugs and other things on the floor that can make you trip. What can I do in the kitchen?  Clean up any spills right away.  Avoid walking on wet floors.  Keep items that you use a lot in easy-to-reach places.  If you need to reach something above you, use a strong step stool that has a grab bar.  Keep electrical cords out of the way.  Do not use floor polish or wax that makes floors slippery. If you must use wax, use non-skid floor wax.  Do not have throw rugs and other things on the floor that can make you trip. What can I do with my stairs?  Do not leave any items on the stairs.  Make  sure that there are handrails on both sides of the stairs and use them. Fix handrails that are broken or loose. Make sure that handrails are as long as the stairways.  Check any carpeting to make sure that it is firmly attached to the stairs. Fix any carpet that is loose or worn.  Avoid having throw rugs at the top or bottom of the stairs. If you do have throw rugs, attach them to the floor with carpet tape.  Make sure that you have a light switch at the top of the stairs and the bottom of the stairs. If you do not have them, ask someone to add them for you. What else can I do to help prevent falls?  Wear shoes that:  Do not have high heels.  Have rubber bottoms.  Are comfortable and fit you well.  Are closed at the toe. Do not wear sandals.  If you use a stepladder:  Make sure that it is fully opened. Do not climb a closed stepladder.  Make sure that both sides of the stepladder are locked into place.  Ask  someone to hold it for you, if possible.  Clearly mark and make sure that you can see:  Any grab bars or handrails.  First and last steps.  Where the edge of each step is.  Use tools that help you move around (mobility aids) if they are needed. These include:  Canes.  Walkers.  Scooters.  Crutches.  Turn on the lights when you go into a dark area. Replace any light bulbs as soon as they burn out.  Set up your furniture so you have a clear path. Avoid moving your furniture around.  If any of your floors are uneven, fix them.  If there are any pets around you, be aware of where they are.  Review your medicines with your doctor. Some medicines can make you feel dizzy. This can increase your chance of falling. Ask your doctor what other things that you can do to help prevent falls. This information is not intended to replace advice given to you by your health care provider. Make sure you discuss any questions you have with your health care provider. Document Released: 12/27/2008 Document Revised: 08/08/2015 Document Reviewed: 04/06/2014 Elsevier Interactive Patient Education  2017 [employer].

## 2018-03-31 LAB — CMP14 + ANION GAP
A/G RATIO: 1.3 (ref 1.2–2.2)
ALT: 9 IU/L (ref 0–32)
AST: 12 IU/L (ref 0–40)
Albumin: 4.3 g/dL (ref 3.5–4.8)
Alkaline Phosphatase: 82 IU/L (ref 39–117)
Anion Gap: 15 mmol/L (ref 10.0–18.0)
BILIRUBIN TOTAL: 0.3 mg/dL (ref 0.0–1.2)
BUN/Creatinine Ratio: 12 (ref 12–28)
BUN: 17 mg/dL (ref 8–27)
CALCIUM: 9.7 mg/dL (ref 8.7–10.3)
CHLORIDE: 104 mmol/L (ref 96–106)
CO2: 21 mmol/L (ref 20–29)
Creatinine, Ser: 1.45 mg/dL — ABNORMAL HIGH (ref 0.57–1.00)
GFR, EST AFRICAN AMERICAN: 42 mL/min/{1.73_m2} — AB (ref >59)
GFR, EST NON AFRICAN AMERICAN: 36 mL/min/{1.73_m2} — AB (ref >59)
GLUCOSE: 93 mg/dL (ref 65–99)
Globulin, Total: 3.2 g/dL (ref 1.5–4.5)
POTASSIUM: 3.9 mmol/L (ref 3.5–5.2)
Sodium: 140 mmol/L (ref 134–144)
TOTAL PROTEIN: 7.5 g/dL (ref 6.0–8.5)

## 2018-03-31 LAB — CBC
HEMOGLOBIN: 13 g/dL (ref 11.1–15.9)
Hematocrit: 39.7 % (ref 34.0–46.6)
MCH: 27.3 pg (ref 26.6–33.0)
MCHC: 32.7 g/dL (ref 31.5–35.7)
MCV: 83 fL (ref 79–97)
PLATELETS: 280 10*3/uL (ref 150–450)
RBC: 4.77 x10E6/uL (ref 3.77–5.28)
RDW: 14.8 % (ref 11.7–15.4)
WBC: 5.7 10*3/uL (ref 3.4–10.8)

## 2018-03-31 LAB — LIPID PANEL
CHOLESTEROL TOTAL: 187 mg/dL (ref 100–199)
Chol/HDL Ratio: 2.8 ratio (ref 0.0–4.4)
HDL: 67 mg/dL (ref >39)
LDL Calculated: 99 mg/dL (ref 0–99)
TRIGLYCERIDES: 106 mg/dL (ref 0–149)
VLDL Cholesterol Cal: 21 mg/dL (ref 5–40)

## 2018-03-31 LAB — HEMOGLOBIN A1C
ESTIMATED AVERAGE GLUCOSE: 126 mg/dL
Hgb A1c MFr Bld: 6 % — ABNORMAL HIGH (ref 4.8–5.6)

## 2018-03-31 LAB — TSH: TSH: 2.5 u[IU]/mL (ref 0.450–4.500)

## 2018-04-06 NOTE — Progress Notes (Signed)
Okay we will recheck her kidney functions in 3 months and see if we need to refer to a kidney specialist

## 2018-04-12 ENCOUNTER — Encounter: Payer: Self-pay | Admitting: Nurse Practitioner

## 2018-04-26 ENCOUNTER — Other Ambulatory Visit: Payer: Self-pay

## 2018-04-26 NOTE — Patient Outreach (Signed)
[facility]) Care Management  04/26/2018  Mhia Mathre 1945-11-30 [id number]   Medication Adherence call to Mrs. Tilman Neat  [employer] verify spoke with patient she explain she has been taking Rosuvastatin 20 mg on a regular basis and prefers to get a 90 days supply instead of a 30 days supply told patient I will give doctor  Grayling Congress a call left a message for doctor to send in a prescription for a 90 days supply to [facility].   Lillia Abed CPhT Pharmacy Technician [facility] Management Direct Dial [phone]  Fax [phone] [email or web address]

## 2018-04-29 ENCOUNTER — Other Ambulatory Visit: Payer: Self-pay

## 2018-04-29 DIAGNOSIS — E785 Hyperlipidemia, unspecified: Secondary | ICD-10-CM

## 2018-04-29 MED ORDER — ROSUVASTATIN CALCIUM 10 MG PO TABS: 10.0000 mg | ORAL_TABLET | Freq: Every day | ORAL | 1 refills | Status: DC

## 2018-05-03 ENCOUNTER — Other Ambulatory Visit: Payer: Self-pay | Admitting: Nurse Practitioner

## 2018-05-03 DIAGNOSIS — N183 Chronic kidney disease, stage 3 unspecified: Secondary | ICD-10-CM

## 2018-05-03 DIAGNOSIS — R7303 Prediabetes: Secondary | ICD-10-CM

## 2018-05-03 DIAGNOSIS — I10 Essential (primary) hypertension: Secondary | ICD-10-CM

## 2018-05-03 NOTE — Progress Notes (Signed)
Referral has been placed at the patient's request

## 2018-05-09 DIAGNOSIS — M1611 Unilateral primary osteoarthritis, right hip: Secondary | ICD-10-CM | POA: Diagnosis not present

## 2018-05-11 ENCOUNTER — Encounter: Payer: Self-pay | Admitting: Internal Medicine

## 2018-05-11 ENCOUNTER — Ambulatory Visit (INDEPENDENT_AMBULATORY_CARE_PROVIDER_SITE_OTHER): Payer: Medicare Other | Admitting: Internal Medicine

## 2018-05-11 ENCOUNTER — Other Ambulatory Visit: Payer: Self-pay

## 2018-05-11 VITALS — BP 140/80 | HR 75 | Temp 98.2°F | Ht 65.6 in | Wt 217.4 lb

## 2018-05-11 DIAGNOSIS — Z6835 Body mass index (BMI) 35.0-35.9, adult: Secondary | ICD-10-CM

## 2018-05-11 DIAGNOSIS — N183 Chronic kidney disease, stage 3 unspecified: Secondary | ICD-10-CM

## 2018-05-11 DIAGNOSIS — M1611 Unilateral primary osteoarthritis, right hip: Secondary | ICD-10-CM | POA: Diagnosis not present

## 2018-05-11 DIAGNOSIS — Z01818 Encounter for other preprocedural examination: Secondary | ICD-10-CM

## 2018-05-11 DIAGNOSIS — R7303 Prediabetes: Secondary | ICD-10-CM | POA: Diagnosis not present

## 2018-05-11 DIAGNOSIS — I129 Hypertensive chronic kidney disease with stage 1 through stage 4 chronic kidney disease, or unspecified chronic kidney disease: Secondary | ICD-10-CM

## 2018-05-11 NOTE — Patient Instructions (Signed)
Chronic Kidney Disease, Adult Chronic kidney disease (CKD) happens when the kidneys are damaged over a long period of time. The kidneys are two organs that help with:  Getting rid of waste and extra fluid from the blood.  Making hormones that maintain the amount of fluid in your tissues and blood vessels.  Making sure that the body has the right amount of fluids and chemicals. Most of the time, CKD does not go away, but it can usually be controlled. Steps must be taken to slow down the kidney damage or to stop it from getting worse. If this is not done, the kidneys may stop working. Follow these instructions at home: Medicines  Take over-the-counter and prescription medicines only as told by your doctor. You may need to change the amount of medicines you take.  Do not take any new medicines unless your doctor says it is okay. Many medicines can make your kidney damage worse.  Do not take any vitamin and supplements unless your doctor says it is okay. Many vitamins and supplements can make your kidney damage worse. General instructions  Follow a diet as told by your doctor. You may need to stay away from: ? Alcohol. ? Salty foods. ? Foods that are high in:  Potassium.  Calcium.  Protein.  Do not use any products that contain nicotine or tobacco, such as cigarettes and e-cigarettes. If you need help quitting, ask your doctor.  Keep track of your blood pressure at home. Tell your doctor about any changes.  If you have diabetes, keep track of your blood sugar as told by your doctor.  Try to stay at a healthy weight. If you need help, ask your doctor.  Exercise at least 30 minutes a day, 5 days a week.  Stay up-to-date with your shots (immunizations) as told by your doctor.  Keep all follow-up visits as told by your doctor. This is important. Contact a doctor if:  Your symptoms get worse.  You have new symptoms. Get help right away if:  You have symptoms of end-stage  kidney disease. These may include: ? Headaches. ? Numbness in your hands or feet. ? Easy bruising. ? Having hiccups often. ? Chest pain. ? Shortness of breath. ? Stopping of menstrual periods in women.  You have a fever.  You have very little pee (urine).  You have pain or bleeding when you pee. Summary  Chronic kidney disease (CKD) happens when the kidneys are damaged over a long period of time.  Most of the time, this condition does not go away, but it can usually be controlled. Steps must be taken to slow down the kidney damage or to stop it from getting worse.  Treatment may include a combination of medicines and lifestyle changes. This information is not intended to replace advice given to you by your health care provider. Make sure you discuss any questions you have with your health care provider. Document Released: 05/27/2009 Document Revised: 04/06/2016 Document Reviewed: 04/06/2016 Elsevier Interactive Patient Education  2019 Elsevier Inc.  

## 2018-05-13 LAB — BMP8+EGFR
BUN/Creatinine Ratio: 20 (ref 12–28)
BUN: 25 mg/dL (ref 8–27)
CHLORIDE: 102 mmol/L (ref 96–106)
CO2: 20 mmol/L (ref 20–29)
Calcium: 9.5 mg/dL (ref 8.7–10.3)
Creatinine, Ser: 1.27 mg/dL — ABNORMAL HIGH (ref 0.57–1.00)
GFR calc non Af Amer: 42 mL/min/{1.73_m2} — ABNORMAL LOW (ref >59)
GFR, EST AFRICAN AMERICAN: 49 mL/min/{1.73_m2} — AB (ref >59)
Glucose: 84 mg/dL (ref 65–99)
Potassium: 4.5 mmol/L (ref 3.5–5.2)
Sodium: 140 mmol/L (ref 134–144)

## 2018-05-13 LAB — PROTEIN ELECTROPHORESIS, SERUM
A/G RATIO SPE: 0.9 (ref 0.7–1.7)
Albumin ELP: 3.6 g/dL (ref 2.9–4.4)
Alpha 1: 0.3 g/dL (ref 0.0–0.4)
Alpha 2: 1.1 g/dL — ABNORMAL HIGH (ref 0.4–1.0)
Beta: 1 g/dL (ref 0.7–1.3)
Gamma Globulin: 1.5 g/dL (ref 0.4–1.8)
Globulin, Total: 3.8 g/dL (ref 2.2–3.9)
Total Protein: 7.4 g/dL (ref 6.0–8.5)

## 2018-05-13 LAB — PHOSPHORUS: Phosphorus: 2.9 mg/dL — ABNORMAL LOW (ref 3.0–4.3)

## 2018-06-05 ENCOUNTER — Encounter: Payer: Self-pay | Admitting: Internal Medicine

## 2018-06-05 NOTE — Progress Notes (Signed)
Subjective:     Patient ID: Tara Guerra , female    DOB: 1945-09-02 , 73 y.o.   MRN: [id number]   Chief Complaint  Patient presents with  . Hip Injury    surgical clearance    HPI  She has r hip pain. She is followed by Ortho. She has been diagnosed with hip osteoarthritis. She now needs clearance for surgery. She denies having sob with exertion. She denies chest pain. She does not get SOB when walking up a flight of stairs. She reports she is fairly active.     Past Medical History:  Diagnosis Date  . Hypercholesteremia   . Hypertension      Family History  Problem Relation Age of Onset  . Alzheimer's disease Mother   . Heart attack Father      Current Outpatient Medications:  .  aspirin EC 81 MG tablet, Take 162 mg by mouth once., Disp: , Rfl:  .  CALCIUM-VITAMIN D PO, Take 1 tablet by mouth daily., Disp: , Rfl:  .  Multiple Vitamin (MULTIVITAMIN WITH MINERALS) TABS tablet, Take 1 tablet by mouth daily., Disp: , Rfl:  .  olmesartan-hydrochlorothiazide (BENICAR HCT) 40-25 MG per tablet, Take 1 tablet by mouth daily.  , Disp: , Rfl:  .  omeprazole (PRILOSEC) 10 MG capsule, TAKE 1 CAPSULE(10 MG) BY MOUTH DAILY, Disp: 90 capsule, Rfl: 1 .  rosuvastatin (CRESTOR) 10 MG tablet, Take 1 tablet (10 mg total) by mouth at bedtime., Disp: 90 tablet, Rfl: 1 .  traMADol (ULTRAM) 50 MG tablet, TAKE 1 TABLET BY MOUTH EVERY 6 HOURS AS NEEDED, Disp: 30 tablet, Rfl: 0 .  ibuprofen (ADVIL,MOTRIN) 200 MG tablet, Take 400 mg by mouth every 6 (six) hours as needed for moderate pain., Disp: , Rfl:  .  Ibuprofen-Diphenhydramine Cit (ADVIL PM) 200-38 MG TABS, Take 1 tablet by mouth as needed (FOR SLEEP)., Disp: , Rfl:  .  meloxicam (MOBIC) 7.5 MG tablet, Take 1 tablet (7.5 mg total) by mouth daily. (Patient not taking: Reported on 03/30/2018), Disp: 30 tablet, Rfl: 0   No Known Allergies   Review of Systems  Constitutional: Negative.   Respiratory: Negative.   Cardiovascular: Negative.    Gastrointestinal: Negative.   Musculoskeletal: Positive for arthralgias (she c/o r hip pain. ).  Neurological: Negative.   Psychiatric/Behavioral: Negative.      Today's Vitals   05/11/18 1444  BP: 140/80  Pulse: 75  Temp: 98.2 F (36.8 C)  TempSrc: Oral  SpO2: 98%  Weight: 217 lb 6.4 oz (98.6 kg)  Height: 5' 5.6" (1.666 m)   Body mass index is 35.52 kg/m.   Objective:  Physical Exam Vitals signs and nursing note reviewed.  Constitutional:      Appearance: Normal appearance.  HENT:     Head: Normocephalic and atraumatic.  Cardiovascular:     Rate and Rhythm: Normal rate and regular rhythm.     Heart sounds: Normal heart sounds.  Pulmonary:     Effort: Pulmonary effort is normal.     Breath sounds: Normal breath sounds.  Skin:    General: Skin is warm.  Neurological:     General: No focal deficit present.     Mental Status: She is alert.  Psychiatric:        Mood and Affect: Mood normal.        Behavior: Behavior normal.         Assessment And Plan:     1. Primary osteoarthritis of right  hip  Chronic She is in need of surgical clearance. I will check [employer] (previous software) for cardiology records prior to offering clearance.   2. Hypertensive nephropathy  Fair control. She will continue with current meds for now. She is encouraged to avoid adding salt to her foods. She is also encouraged to avoid packaged foods which tend to be high in sodium.   3. Chronic renal disease, stage III (HCC)  Chronic. She is encouraged to stay well hydrated.  I will check labs as listed below.   - BMP8+EGFR - Phosphorus - Protein electrophoresis, serum  4. Pre-diabetes  HER A1C HAS BEEN ELEVATED IN THE PAST. I WILL CHECK AN A1C, BMET TODAY. SHE WAS ENCOURAGED TO AVOID SUGARY BEVERAGES AND PROCESSED FOODS INCLUDNG BREADS, RICE AND PASTA.   5. Class 2 severe obesity due to excess calories with serious comorbidity and body mass index (BMI) of 35.0 to 35.9 in adult  [facility])  Importance of achieving optimal weight to decrease risk of cardiovascular disease and cancers was discussed with the patient in full detail. She is encouraged to start slowly - start with 10 minutes twice daily at least three to four days per week and to gradually build to 30 minutes five days weekly. She was given tips to incorporate more activity into her daily routine - take stairs when possible, park farther away from her grocery stores, etc.   Maximino Greenland, MD

## 2018-06-30 ENCOUNTER — Ambulatory Visit: Payer: Medicare Other | Admitting: Internal Medicine

## 2018-06-30 ENCOUNTER — Ambulatory Visit: Payer: PPO | Admitting: Nurse Practitioner

## 2018-07-18 ENCOUNTER — Other Ambulatory Visit: Payer: Self-pay | Admitting: Internal Medicine

## 2018-07-18 MED ORDER — TRAMADOL HCL 50 MG PO TABS: 50.0000 mg | ORAL_TABLET | Freq: Four times a day (QID) | ORAL | 0 refills | Status: DC | PRN

## 2018-07-22 ENCOUNTER — Telehealth: Payer: Self-pay

## 2018-07-22 NOTE — Telephone Encounter (Signed)
Called to see if pt was having SOB with exertion or walking up steps. Pt denies both

## 2018-07-28 DIAGNOSIS — N189 Chronic kidney disease, unspecified: Secondary | ICD-10-CM | POA: Diagnosis not present

## 2018-07-28 DIAGNOSIS — E559 Vitamin D deficiency, unspecified: Secondary | ICD-10-CM | POA: Diagnosis not present

## 2018-07-28 DIAGNOSIS — D631 Anemia in chronic kidney disease: Secondary | ICD-10-CM | POA: Diagnosis not present

## 2018-07-28 DIAGNOSIS — I129 Hypertensive chronic kidney disease with stage 1 through stage 4 chronic kidney disease, or unspecified chronic kidney disease: Secondary | ICD-10-CM | POA: Diagnosis not present

## 2018-07-28 DIAGNOSIS — N183 Chronic kidney disease, stage 3 (moderate): Secondary | ICD-10-CM | POA: Diagnosis not present

## 2018-08-01 ENCOUNTER — Other Ambulatory Visit: Payer: Self-pay | Admitting: Nephrology

## 2018-08-01 DIAGNOSIS — N183 Chronic kidney disease, stage 3 unspecified: Secondary | ICD-10-CM

## 2018-08-11 ENCOUNTER — Ambulatory Visit
Admission: RE | Admit: 2018-08-11 | Discharge: 2018-08-11 | Disposition: A | Discharge status: Home / Self Care | Payer: Medicare Other | Source: Ambulatory Visit | Attending: Nephrology | Admitting: Nephrology

## 2018-08-11 ENCOUNTER — Other Ambulatory Visit: Payer: Self-pay

## 2018-08-11 ENCOUNTER — Telehealth: Payer: Self-pay | Admitting: Internal Medicine

## 2018-08-11 DIAGNOSIS — N189 Chronic kidney disease, unspecified: Secondary | ICD-10-CM | POA: Diagnosis not present

## 2018-08-11 DIAGNOSIS — N183 Chronic kidney disease, stage 3 unspecified: Secondary | ICD-10-CM

## 2018-08-11 NOTE — Telephone Encounter (Signed)
Called pt to confirm appt no answer and vm is full

## 2018-08-15 ENCOUNTER — Ambulatory Visit: Payer: Medicare Other | Admitting: Internal Medicine

## 2018-08-15 ENCOUNTER — Other Ambulatory Visit: Payer: Self-pay

## 2018-08-17 ENCOUNTER — Other Ambulatory Visit: Payer: Self-pay

## 2018-08-17 ENCOUNTER — Ambulatory Visit (INDEPENDENT_AMBULATORY_CARE_PROVIDER_SITE_OTHER): Payer: Medicare Other | Admitting: Internal Medicine

## 2018-08-17 ENCOUNTER — Encounter: Payer: Self-pay | Admitting: Internal Medicine

## 2018-08-17 VITALS — BP 110/78 | HR 73 | Temp 98.6°F | Ht 65.6 in | Wt 219.4 lb

## 2018-08-17 DIAGNOSIS — M1611 Unilateral primary osteoarthritis, right hip: Secondary | ICD-10-CM

## 2018-08-17 DIAGNOSIS — N183 Chronic kidney disease, stage 3 unspecified: Secondary | ICD-10-CM

## 2018-08-17 DIAGNOSIS — Z01818 Encounter for other preprocedural examination: Secondary | ICD-10-CM | POA: Diagnosis not present

## 2018-08-17 DIAGNOSIS — I129 Hypertensive chronic kidney disease with stage 1 through stage 4 chronic kidney disease, or unspecified chronic kidney disease: Secondary | ICD-10-CM | POA: Diagnosis not present

## 2018-08-17 NOTE — Progress Notes (Signed)
Subjective:     Patient ID: Tara Guerra , female    DOB: 05/13/1945 , 73 y.o.   MRN: [id number]   Chief Complaint  Patient presents with  . Hypertension  . Surgical clearance    HPI  Hypertension  This is a chronic problem. The current episode started more than 1 year ago. The problem has been gradually improving since onset. The problem is controlled. Pertinent negatives include no blurred vision, chest pain, palpitations or shortness of breath. The current treatment provides moderate improvement. Hypertensive end-organ damage includes kidney disease.     Past Medical History:  Diagnosis Date  . Hypercholesteremia   . Hypertension      Family History  Problem Relation Age of Onset  . Alzheimer's disease Mother   . Heart attack Father      Current Outpatient Medications:  .  CALCIUM-VITAMIN D PO, Take 1 tablet by mouth daily., Disp: , Rfl:  .  Multiple Vitamin (MULTIVITAMIN WITH MINERALS) TABS tablet, Take 1 tablet by mouth daily., Disp: , Rfl:  .  olmesartan-hydrochlorothiazide (BENICAR HCT) 40-25 MG per tablet, Take 1 tablet by mouth daily.  , Disp: , Rfl:  .  omeprazole (PRILOSEC) 10 MG capsule, TAKE 1 CAPSULE(10 MG) BY MOUTH DAILY, Disp: 90 capsule, Rfl: 1 .  rosuvastatin (CRESTOR) 10 MG tablet, Take 1 tablet (10 mg total) by mouth at bedtime., Disp: 90 tablet, Rfl: 1 .  traMADol (ULTRAM) 50 MG tablet, Take 1 tablet (50 mg total) by mouth every 6 (six) hours as needed., Disp: 30 tablet, Rfl: 0   No Known Allergies   Review of Systems  Constitutional: Negative.   Eyes: Negative for blurred vision.  Respiratory: Negative.  Negative for shortness of breath.   Cardiovascular: Negative.  Negative for chest pain and palpitations.  Gastrointestinal: Negative.   Musculoskeletal: Positive for arthralgias.       She c/o r hip pain. Followed by Ortho. Scheduled for hip replacement. Needs cardiac clearance  Neurological: Negative.   Psychiatric/Behavioral: Negative.       Today's Vitals   08/17/18 0949  BP: 110/78  Pulse: 73  Temp: 98.6 F (37 C)  TempSrc: Oral  Weight: 219 lb 6.4 oz (99.5 kg)  Height: 5' 5.6" (1.666 m)  PainSc: 0-No pain   Body mass index is 35.85 kg/m.   Objective:  Physical Exam Vitals signs and nursing note reviewed.  Constitutional:      Appearance: Normal appearance.  HENT:     Head: Normocephalic and atraumatic.  Cardiovascular:     Rate and Rhythm: Normal rate and regular rhythm.     Heart sounds: Normal heart sounds.  Pulmonary:     Effort: Pulmonary effort is normal.     Breath sounds: Normal breath sounds.  Skin:    General: Skin is warm.  Neurological:     General: No focal deficit present.     Mental Status: She is alert.  Psychiatric:        Mood and Affect: Mood normal.        Behavior: Behavior normal.         Assessment And Plan:     1. Hypertensive nephropathy  Well controlled. She will continue with current meds. She is encouraged to avoid adding salt to her foods.   2. Chronic renal disease, stage III (HCC)  Chronic. She is encouraged to stay well hydrated.   3. Pre-operative clearance  Previous stress test results reviewed in full detail. Previous EKGs reviewed, NSR.  I will refer her to cardiology for cardiac pre-op clearance. She denies DOE and SOB at rest.     4. Primary osteoarthritis of right hip  Chronic. She is ready for total hip replacement. She now has difficulty with ambulation up steps, due to hip pain.    Gwynneth Aliment, MD    THE PATIENT IS ENCOURAGED TO PRACTICE SOCIAL DISTANCING DUE TO THE COVID-19 PANDEMIC.

## 2018-08-23 DIAGNOSIS — I10 Essential (primary) hypertension: Secondary | ICD-10-CM | POA: Diagnosis not present

## 2018-08-23 DIAGNOSIS — E785 Hyperlipidemia, unspecified: Secondary | ICD-10-CM | POA: Diagnosis not present

## 2018-08-23 DIAGNOSIS — N189 Chronic kidney disease, unspecified: Secondary | ICD-10-CM | POA: Diagnosis not present

## 2018-08-23 DIAGNOSIS — E1122 Type 2 diabetes mellitus with diabetic chronic kidney disease: Secondary | ICD-10-CM | POA: Diagnosis not present

## 2018-08-23 DIAGNOSIS — Z01810 Encounter for preprocedural cardiovascular examination: Secondary | ICD-10-CM | POA: Diagnosis not present

## 2018-09-06 ENCOUNTER — Other Ambulatory Visit: Payer: Self-pay | Admitting: Internal Medicine

## 2018-09-06 NOTE — Telephone Encounter (Signed)
Tramadol refill

## 2018-09-19 ENCOUNTER — Encounter: Payer: PPO | Admitting: Internal Medicine

## 2018-09-19 ENCOUNTER — Ambulatory Visit: Payer: Self-pay | Admitting: Orthopedic Surgery

## 2018-09-19 ENCOUNTER — Encounter: Payer: Self-pay | Admitting: Internal Medicine

## 2018-09-19 ENCOUNTER — Ambulatory Visit (INDEPENDENT_AMBULATORY_CARE_PROVIDER_SITE_OTHER): Payer: Medicare Other | Admitting: Internal Medicine

## 2018-09-19 ENCOUNTER — Other Ambulatory Visit: Payer: Self-pay

## 2018-09-19 VITALS — BP 114/70 | HR 80 | Temp 98.9°F | Ht 66.2 in | Wt 221.0 lb

## 2018-09-19 DIAGNOSIS — R7303 Prediabetes: Secondary | ICD-10-CM

## 2018-09-19 DIAGNOSIS — M25551 Pain in right hip: Secondary | ICD-10-CM

## 2018-09-19 DIAGNOSIS — N183 Chronic kidney disease, stage 3 unspecified: Secondary | ICD-10-CM

## 2018-09-19 DIAGNOSIS — Z0000 Encounter for general adult medical examination without abnormal findings: Secondary | ICD-10-CM

## 2018-09-19 DIAGNOSIS — Z6835 Body mass index (BMI) 35.0-35.9, adult: Secondary | ICD-10-CM

## 2018-09-19 DIAGNOSIS — E2839 Other primary ovarian failure: Secondary | ICD-10-CM | POA: Diagnosis not present

## 2018-09-19 DIAGNOSIS — E66812 Obesity, class 2: Secondary | ICD-10-CM

## 2018-09-19 DIAGNOSIS — I129 Hypertensive chronic kidney disease with stage 1 through stage 4 chronic kidney disease, or unspecified chronic kidney disease: Secondary | ICD-10-CM

## 2018-09-19 LAB — POCT UA - MICROALBUMIN
Albumin/Creatinine Ratio, Urine, POC: 30
Creatinine, POC: 200 mg/dL
Microalbumin Ur, POC: 10 mg/L

## 2018-09-19 LAB — POCT URINALYSIS DIPSTICK
Bilirubin, UA: NEGATIVE
Blood, UA: NEGATIVE
Glucose, UA: NEGATIVE
Ketones, UA: NEGATIVE
Nitrite, UA: NEGATIVE
Protein, UA: NEGATIVE
Spec Grav, UA: 1.015 (ref 1.010–1.025)
Urobilinogen, UA: 0.2 E.U./dL
pH, UA: 6.5 (ref 5.0–8.0)

## 2018-09-19 NOTE — Progress Notes (Signed)
poc

## 2018-09-19 NOTE — Patient Instructions (Signed)
Health Maintenance, Female Adopting a healthy lifestyle and getting preventive care are important in promoting health and wellness. Ask your health care provider about:  The right schedule for you to have regular tests and exams.  Things you can do on your own to prevent diseases and keep yourself healthy. What should I know about diet, weight, and exercise? Eat a healthy diet   Eat a diet that includes plenty of vegetables, fruits, low-fat dairy products, and lean protein.  Do not eat a lot of foods that are high in solid fats, added sugars, or sodium. Maintain a healthy weight Body mass index (BMI) is used to identify weight problems. It estimates body fat based on height and weight. Your health care provider can help determine your BMI and help you achieve or maintain a healthy weight. Get regular exercise Get regular exercise. This is one of the most important things you can do for your health. Most adults should:  Exercise for at least 150 minutes each week. The exercise should increase your heart rate and make you sweat (moderate-intensity exercise).  Do strengthening exercises at least twice a week. This is in addition to the moderate-intensity exercise.  Spend less time sitting. Even light physical activity can be beneficial. Watch cholesterol and blood lipids Have your blood tested for lipids and cholesterol at 73 years of age, then have this test every 5 years. Have your cholesterol levels checked more often if:  Your lipid or cholesterol levels are high.  You are older than 73 years of age.  You are at high risk for heart disease. What should I know about cancer screening? Depending on your health history and family history, you may need to have cancer screening at various ages. This may include screening for:  Breast cancer.  Cervical cancer.  Colorectal cancer.  Skin cancer.  Lung cancer. What should I know about heart disease, diabetes, and high blood  pressure? Blood pressure and heart disease  High blood pressure causes heart disease and increases the risk of stroke. This is more likely to develop in people who have high blood pressure readings, are of African descent, or are overweight.  Have your blood pressure checked: ? Every 3-5 years if you are 18-39 years of age. ? Every year if you are 40 years old or older. Diabetes Have regular diabetes screenings. This checks your fasting blood sugar level. Have the screening done:  Once every three years after age 40 if you are at a normal weight and have a low risk for diabetes.  More often and at a younger age if you are overweight or have a high risk for diabetes. What should I know about preventing infection? Hepatitis B If you have a higher risk for hepatitis B, you should be screened for this virus. Talk with your health care provider to find out if you are at risk for hepatitis B infection. Hepatitis C Testing is recommended for:  Everyone born from 1945 through 1965.  Anyone with known risk factors for hepatitis C. Sexually transmitted infections (STIs)  Get screened for STIs, including gonorrhea and chlamydia, if: ? You are sexually active and are younger than 73 years of age. ? You are older than 73 years of age and your health care provider tells you that you are at risk for this type of infection. ? Your sexual activity has changed since you were last screened, and you are at increased risk for chlamydia or gonorrhea. Ask your health care provider if   you are at risk.  Ask your health care provider about whether you are at high risk for HIV. Your health care provider may recommend a prescription medicine to help prevent HIV infection. If you choose to take medicine to prevent HIV, you should first get tested for HIV. You should then be tested every 3 months for as long as you are taking the medicine. Pregnancy  If you are about to stop having your period (premenopausal) and  you may become pregnant, seek counseling before you get pregnant.  Take 400 to 800 micrograms (mcg) of folic acid every day if you become pregnant.  Ask for birth control (contraception) if you want to prevent pregnancy. Osteoporosis and menopause Osteoporosis is a disease in which the bones lose minerals and strength with aging. This can result in bone fractures. If you are 65 years old or older, or if you are at risk for osteoporosis and fractures, ask your health care provider if you should:  Be screened for bone loss.  Take a calcium or vitamin D supplement to lower your risk of fractures.  Be given hormone replacement therapy (HRT) to treat symptoms of menopause. Follow these instructions at home: Lifestyle  Do not use any products that contain nicotine or tobacco, such as cigarettes, e-cigarettes, and chewing tobacco. If you need help quitting, ask your health care provider.  Do not use street drugs.  Do not share needles.  Ask your health care provider for help if you need support or information about quitting drugs. Alcohol use  Do not drink alcohol if: ? Your health care provider tells you not to drink. ? You are pregnant, may be pregnant, or are planning to become pregnant.  If you drink alcohol: ? Limit how much you use to 0-1 drink a day. ? Limit intake if you are breastfeeding.  Be aware of how much alcohol is in your drink. In the [place] one drink equals one 12 oz bottle of beer (355 mL), one 5 oz glass of wine (148 mL), or one 1 oz glass of hard liquor (44 mL). General instructions  Schedule regular health, dental, and eye exams.  Stay current with your vaccines.  Tell your health care provider if: ? You often feel depressed. ? You have ever been abused or do not feel safe at home. Summary  Adopting a healthy lifestyle and getting preventive care are important in promoting health and wellness.  Follow your health care provider's instructions about healthy  diet, exercising, and getting tested or screened for diseases.  Follow your health care provider's instructions on monitoring your cholesterol and blood pressure. This information is not intended to replace advice given to you by your health care provider. Make sure you discuss any questions you have with your health care provider. Document Released: 09/15/2010 Document Revised: 02/23/2018 Document Reviewed: 02/23/2018 Elsevier Patient Education  2020 Elsevier Inc.  

## 2018-09-20 ENCOUNTER — Ambulatory Visit: Payer: Self-pay | Admitting: Orthopedic Surgery

## 2018-09-20 LAB — LIPID PANEL
Chol/HDL Ratio: 2.7 ratio (ref 0.0–4.4)
Cholesterol, Total: 172 mg/dL (ref 100–199)
HDL: 63 mg/dL (ref >39)
LDL Calculated: 90 mg/dL (ref 0–99)
Triglycerides: 96 mg/dL (ref 0–149)
VLDL Cholesterol Cal: 19 mg/dL (ref 5–40)

## 2018-09-20 LAB — CMP14+EGFR
ALT: 9 IU/L (ref 0–32)
AST: 10 IU/L (ref 0–40)
Albumin/Globulin Ratio: 1.5 (ref 1.2–2.2)
Albumin: 4.3 g/dL (ref 3.7–4.7)
Alkaline Phosphatase: 71 IU/L (ref 39–117)
BUN/Creatinine Ratio: 13 (ref 12–28)
BUN: 18 mg/dL (ref 8–27)
Bilirubin Total: 0.5 mg/dL (ref 0.0–1.2)
CO2: 22 mmol/L (ref 20–29)
Calcium: 9.6 mg/dL (ref 8.7–10.3)
Chloride: 104 mmol/L (ref 96–106)
Creatinine, Ser: 1.44 mg/dL — ABNORMAL HIGH (ref 0.57–1.00)
GFR calc Af Amer: 42 mL/min/{1.73_m2} — ABNORMAL LOW (ref >59)
GFR calc non Af Amer: 36 mL/min/{1.73_m2} — ABNORMAL LOW (ref >59)
Globulin, Total: 2.9 g/dL (ref 1.5–4.5)
Glucose: 88 mg/dL (ref 65–99)
Potassium: 3.9 mmol/L (ref 3.5–5.2)
Sodium: 141 mmol/L (ref 134–144)
Total Protein: 7.2 g/dL (ref 6.0–8.5)

## 2018-09-20 LAB — CBC
Hematocrit: 38.7 % (ref 34.0–46.6)
Hemoglobin: 12.7 g/dL (ref 11.1–15.9)
MCH: 27.3 pg (ref 26.6–33.0)
MCHC: 32.8 g/dL (ref 31.5–35.7)
MCV: 83 fL (ref 79–97)
Platelets: 275 10*3/uL (ref 150–450)
RBC: 4.66 x10E6/uL (ref 3.77–5.28)
RDW: 15.3 % (ref 11.7–15.4)
WBC: 5.2 10*3/uL (ref 3.4–10.8)

## 2018-09-20 LAB — HEMOGLOBIN A1C
Est. average glucose Bld gHb Est-mCnc: 126 mg/dL
Hgb A1c MFr Bld: 6 % — ABNORMAL HIGH (ref 4.8–5.6)

## 2018-09-20 NOTE — H&P (View-Only) (Signed)
TOTAL HIP ADMISSION H&P  Patient is admitted for right total hip arthroplasty.  Subjective:  Chief Complaint: right hip pain  HPI: Tara Guerra, 73 y.o. female, has a history of pain and functional disability in the right hip(s) due to arthritis and patient has failed non-surgical conservative treatments for greater than 12 weeks to include NSAID's and/or analgesics, corticosteriod injections, viscosupplementation injections, flexibility and strengthening excercises, use of assistive devices, weight reduction as appropriate and activity modification.  Onset of symptoms was gradual starting 4 years ago with gradually worsening course since that time.The patient noted no past surgery on the right hip(s).  Patient currently rates pain in the right hip at 10 out of 10 with activity. Patient has night pain, worsening of pain with activity and weight bearing, trendelenberg gait, pain that interfers with activities of daily living, pain with passive range of motion and crepitus. Patient has evidence of subchondral cysts, subchondral sclerosis, periarticular osteophytes and joint space narrowing by imaging studies. This condition presents safety issues increasing the risk of falls.   There is no current active infection.  Patient Active Problem List   Diagnosis Date Noted  . Chest pain 11/18/2014  . SKIN RASH 01/06/2010  . ABSCESS, TOOTH 11/04/2009  . TINEA CRURIS 10/22/2009  . [place], [place] 10/22/2009  . DENTAL PAIN 04/09/2009  . VITAMIN D DEFICIENCY 01/24/2009  . Hyperlipidemia 01/24/2009  . Essential hypertension, benign 01/24/2009   Past Medical History:  Diagnosis Date  . Hypercholesteremia   . Hypertension     No past surgical history on file.  Current Outpatient Medications  Medication Sig Dispense Refill Last Dose  . Menthol, Topical Analgesic, (BENGAY EX) Apply 1 application topically daily as needed (pain).   Taking  . olmesartan-hydrochlorothiazide (BENICAR HCT) 40-25 MG  per tablet Take 1 tablet by mouth daily.     Taking  . omeprazole (PRILOSEC) 10 MG capsule TAKE 1 CAPSULE(10 MG) BY MOUTH DAILY (Patient taking differently: Take 10 mg by mouth daily. ) 90 capsule 1 Taking  . rosuvastatin (CRESTOR) 10 MG tablet Take 1 tablet (10 mg total) by mouth at bedtime. 90 tablet 1 Taking  . traMADol (ULTRAM) 50 MG tablet TAKE 1 TABLET BY MOUTH EVERY 6 HOURS AS NEEDED (Patient taking differently: Take 50 mg by mouth every 6 (six) hours as needed for moderate pain. ) 30 tablet 0 Taking  . TURMERIC PO Take 1,000 mg by mouth daily.   Taking  . Vitamin D, Ergocalciferol, (DRISDOL) 1.25 MG (50000 UT) CAPS capsule Take 5,000 Units by mouth once a week.   Taking   No current facility-administered medications for this visit.    No Known Allergies  Social History   Tobacco Use  . Smoking status: Never Smoker  . Smokeless tobacco: Never Used  Substance Use Topics  . Alcohol use: No    Family History  Problem Relation Age of Onset  . Alzheimer's disease Mother   . Heart attack Father      Review of Systems  Constitutional: Negative.   HENT: Negative.   Eyes: Negative.   Respiratory: Negative.   Cardiovascular: Negative.   Gastrointestinal: Positive for heartburn.  Genitourinary: Negative.   Musculoskeletal: Positive for back pain and joint pain.  Skin: Negative.   Neurological: Negative.   Endo/Heme/Allergies: Negative.   Psychiatric/Behavioral: Negative.     Objective:  Physical Exam  Vitals reviewed. Constitutional: She is oriented to person, place, and time. She appears well-developed and well-nourished.  HENT:  Head: Normocephalic and atraumatic.  Eyes: Pupils are equal, round, and reactive to light. Conjunctivae and EOM are normal.  Neck: Normal range of motion. Neck supple.  Cardiovascular: Normal rate, regular rhythm and intact distal pulses.  Respiratory: Effort normal. No respiratory distress.  GI: Soft. She exhibits no distension.   Genitourinary:    Genitourinary Comments: deferred   Musculoskeletal:     Right hip: She exhibits decreased range of motion, decreased strength and bony tenderness.  Neurological: She is alert and oriented to person, place, and time. She has normal reflexes.  Skin: Skin is warm.  Psychiatric: She has a normal mood and affect. Her behavior is normal. Judgment and thought content normal.    Vital signs in last 24 hours: @VSRANGES @  Labs:   Estimated body mass index is 35.46 kg/m as calculated from the following:   Height as of 09/19/18: 5' 6.2" (1.681 m).   Weight as of 09/19/18: 100.2 kg.   Imaging Review Plain radiographs demonstrate severe degenerative joint disease of the right hip(s). The bone quality appears to be adequate for age and reported activity level.      Assessment/Plan:  End stage arthritis, right hip(s)  The patient history, physical examination, clinical judgement of the provider and imaging studies are consistent with end stage degenerative joint disease of the right hip(s) and total hip arthroplasty is deemed medically necessary. The treatment options including medical management, injection therapy, arthroscopy and arthroplasty were discussed at length. The risks and benefits of total hip arthroplasty were presented and reviewed. The risks due to aseptic loosening, infection, stiffness, dislocation/subluxation,  thromboembolic complications and other imponderables were discussed.  The patient acknowledged the explanation, agreed to proceed with the plan and consent was signed. Patient is being admitted for inpatient treatment for surgery, pain control, PT, OT, prophylactic antibiotics, VTE prophylaxis, progressive ambulation and ADL's and discharge planning.The patient is planning to be discharged home with HEP.    Patient's anticipated LOS is less than 2 midnights, meeting these requirements: - Younger than [id number] - Lives within 1 hour of care - Has a competent  adult at home to recover with post-op recover - NO history of  - Chronic pain requiring opiods  - Diabetes  - Coronary Artery Disease  - Heart failure  - Heart attack  - Stroke  - DVT/VTE  - Cardiac arrhythmia  - Respiratory Failure/COPD  - Renal failure  - Anemia  - Advanced Liver disease

## 2018-09-20 NOTE — H&P (Signed)
TOTAL HIP ADMISSION H&P  Patient is admitted for right total hip arthroplasty.  Subjective:  Chief Complaint: right hip pain  HPI: Tara Guerra, 73 y.o. female, has a history of pain and functional disability in the right hip(s) due to arthritis and patient has failed non-surgical conservative treatments for greater than 12 weeks to include NSAID's and/or analgesics, corticosteriod injections, viscosupplementation injections, flexibility and strengthening excercises, use of assistive devices, weight reduction as appropriate and activity modification.  Onset of symptoms was gradual starting 4 years ago with gradually worsening course since that time.The patient noted no past surgery on the right hip(s).  Patient currently rates pain in the right hip at 10 out of 10 with activity. Patient has night pain, worsening of pain with activity and weight bearing, trendelenberg gait, pain that interfers with activities of daily living, pain with passive range of motion and crepitus. Patient has evidence of subchondral cysts, subchondral sclerosis, periarticular osteophytes and joint space narrowing by imaging studies. This condition presents safety issues increasing the risk of falls.   There is no current active infection.  Patient Active Problem List   Diagnosis Date Noted  . Chest pain 11/18/2014  . SKIN RASH 01/06/2010  . ABSCESS, TOOTH 11/04/2009  . TINEA CRURIS 10/22/2009  . [place], [place] 10/22/2009  . DENTAL PAIN 04/09/2009  . VITAMIN D DEFICIENCY 01/24/2009  . Hyperlipidemia 01/24/2009  . Essential hypertension, benign 01/24/2009   Past Medical History:  Diagnosis Date  . Hypercholesteremia   . Hypertension     No past surgical history on file.  Current Outpatient Medications  Medication Sig Dispense Refill Last Dose  . Menthol, Topical Analgesic, (BENGAY EX) Apply 1 application topically daily as needed (pain).   Taking  . olmesartan-hydrochlorothiazide (BENICAR HCT) 40-25 MG  per tablet Take 1 tablet by mouth daily.     Taking  . omeprazole (PRILOSEC) 10 MG capsule TAKE 1 CAPSULE(10 MG) BY MOUTH DAILY (Patient taking differently: Take 10 mg by mouth daily. ) 90 capsule 1 Taking  . rosuvastatin (CRESTOR) 10 MG tablet Take 1 tablet (10 mg total) by mouth at bedtime. 90 tablet 1 Taking  . traMADol (ULTRAM) 50 MG tablet TAKE 1 TABLET BY MOUTH EVERY 6 HOURS AS NEEDED (Patient taking differently: Take 50 mg by mouth every 6 (six) hours as needed for moderate pain. ) 30 tablet 0 Taking  . TURMERIC PO Take 1,000 mg by mouth daily.   Taking  . Vitamin D, Ergocalciferol, (DRISDOL) 1.25 MG (50000 UT) CAPS capsule Take 5,000 Units by mouth once a week.   Taking   No current facility-administered medications for this visit.    No Known Allergies  Social History   Tobacco Use  . Smoking status: Never Smoker  . Smokeless tobacco: Never Used  Substance Use Topics  . Alcohol use: No    Family History  Problem Relation Age of Onset  . Alzheimer's disease Mother   . Heart attack Father      Review of Systems  Constitutional: Negative.   HENT: Negative.   Eyes: Negative.   Respiratory: Negative.   Cardiovascular: Negative.   Gastrointestinal: Positive for heartburn.  Genitourinary: Negative.   Musculoskeletal: Positive for back pain and joint pain.  Skin: Negative.   Neurological: Negative.   Endo/Heme/Allergies: Negative.   Psychiatric/Behavioral: Negative.     Objective:  Physical Exam  Vitals reviewed. Constitutional: She is oriented to person, place, and time. She appears well-developed and well-nourished.  HENT:  Head: Normocephalic and atraumatic.  Eyes: Pupils are equal, round, and reactive to light. Conjunctivae and EOM are normal.  Neck: Normal range of motion. Neck supple.  Cardiovascular: Normal rate, regular rhythm and intact distal pulses.  Respiratory: Effort normal. No respiratory distress.  GI: Soft. She exhibits no distension.   Genitourinary:    Genitourinary Comments: deferred   Musculoskeletal:     Right hip: She exhibits decreased range of motion, decreased strength and bony tenderness.  Neurological: She is alert and oriented to person, place, and time. She has normal reflexes.  Skin: Skin is warm.  Psychiatric: She has a normal mood and affect. Her behavior is normal. Judgment and thought content normal.    Vital signs in last 24 hours: @VSRANGES@  Labs:   Estimated body mass index is 35.46 kg/m as calculated from the following:   Height as of 09/19/18: 5' 6.2" (1.681 m).   Weight as of 09/19/18: 100.2 kg.   Imaging Review Plain radiographs demonstrate severe degenerative joint disease of the right hip(s). The bone quality appears to be adequate for age and reported activity level.      Assessment/Plan:  End stage arthritis, right hip(s)  The patient history, physical examination, clinical judgement of the provider and imaging studies are consistent with end stage degenerative joint disease of the right hip(s) and total hip arthroplasty is deemed medically necessary. The treatment options including medical management, injection therapy, arthroscopy and arthroplasty were discussed at length. The risks and benefits of total hip arthroplasty were presented and reviewed. The risks due to aseptic loosening, infection, stiffness, dislocation/subluxation,  thromboembolic complications and other imponderables were discussed.  The patient acknowledged the explanation, agreed to proceed with the plan and consent was signed. Patient is being admitted for inpatient treatment for surgery, pain control, PT, OT, prophylactic antibiotics, VTE prophylaxis, progressive ambulation and ADL's and discharge planning.The patient is planning to be discharged home with HEP.    Patient's anticipated LOS is less than 2 midnights, meeting these requirements: - Younger than 65 - Lives within 1 hour of care - Has a competent  adult at home to recover with post-op recover - NO history of  - Chronic pain requiring opiods  - Diabetes  - Coronary Artery Disease  - Heart failure  - Heart attack  - Stroke  - DVT/VTE  - Cardiac arrhythmia  - Respiratory Failure/COPD  - Renal failure  - Anemia  - Advanced Liver disease     

## 2018-09-22 NOTE — Patient Instructions (Addendum)
YOU NEED TO HAVE A COVID 19 TEST ON 09-26-2018 @ 200 PM, THIS TEST MUST BE DONE BEFORE SURGERY, COME TO [facility]. ONCE YOUR COVID TEST IS COMPLETED, PLEASE BEGIN THE QUARANTINE INSTRUCTIONS AS OUTLINED IN YOUR HANDOUT.                Tara Guerra     Your procedure is scheduled on: 09-29-2018   Report to [facility] Main  Entrance  Report to admitting at 10:55 AM      Call this number if you have problems the morning of surgery [phone]     Remember :BRUSH YOUR TEETH MORNING OF SURGERY AND RINSE YOUR MOUTH OUT, NO CHEWING GUM CANDY OR MINTS.   NO SOLID FOOD AFTER MIDNIGHT THE NIGHT PRIOR TO SURGERY . NOTHING BY MOUTH EXCEPT CLEAR LIQUIDS UNTIL 10:25 AM.  PLEASE FINISH ENSURE DRINK PER SURGEON ORDER 3 HOURS PRIOR TO SCHEDULED SURGERY TIME WHICH NEEDS TO BE COMPLETED AT  10:25 AM.   CLEAR LIQUID DIET   Foods Allowed                                                                     Foods Excluded  Coffee and tea, regular and decaf                             liquids that you cannot  Plain Jell-O in any flavor                                             see through such as: Fruit ices (not with fruit pulp)                                     milk, soups, orange juice  Iced Popsicles                                    All solid food Carbonated beverages, regular and diet                                    Cranberry, grape and apple juices Sports drinks like Gatorade Lightly seasoned clear broth or consume(fat free) Sugar, honey syrup  Sample Menu Breakfast                                Lunch                                     Supper Cranberry juice                    Beef broth  Chicken broth Jell-O                                     Grape juice                           Apple juice Coffee or tea                        Jell-O                                      Popsicle                           Coffee or tea                        Coffee or tea  _____________________________________________________________________     Take these medicines the morning of surgery with A SIP OF WATER: Prilosec                                You may not have any metal on your body including hair pins and              piercings             Do not wear jewelry, make-up, lotions, powders or perfumes, deodorant             Do not wear nail polish.  Do not shave  48 hours prior to surgery.                Do not bring valuables to the hospital. [facility] IS NOT             RESPONSIBLE   FOR VALUABLES.  Contacts, dentures or bridgework may not be worn into surgery.      _____________________________________________________________________             [facility] Before surgery, you can play an important role.   Because skin is not sterile, your skin needs to be as free of germs as possible.   You can reduce the number of germs on your skin by washing with CHG (chlorahexidine gluconate) soap before surgery.   CHG is an antiseptic cleaner which kills germs and bonds with the skin to continue killing germs even after washing. Please DO NOT use if you have an allergy to CHG or antibacterial soaps.   If your skin becomes reddened/irritated stop using the CHG and inform your nurse when you arrive at Short Stay. Do not shave (including legs and underarms) for at least 48 hours prior to the first CHG shower.  Please follow these instructions carefully:  1.  Shower with CHG Soap the night before surgery and the  morning of Surgery.  2.  If you choose to wash your hair, wash your hair first as usual with your  normal  shampoo.  3.  After you shampoo, rinse your hair and body thoroughly to remove the  shampoo.  4.  Use CHG as you would any other liquid soap.  You can apply chg directly  to the skin and wash                        Gently with a scrungie or clean washcloth.  5.  Apply the CHG Soap to your body ONLY FROM THE NECK DOWN.   Do not use on face/ open                           Wound or open sores. Avoid contact with eyes, ears mouth and genitals (private parts).                       Wash face,  Genitals (private parts) with your normal soap.             6.  Wash thoroughly, paying special attention to the area where your surgery  will be performed.  7.  Thoroughly rinse your body with warm water from the neck down.  8.  DO NOT shower/wash with your normal soap after using and rinsing off  the CHG Soap.                9.  Pat yourself dry with a clean towel.            10.  Wear clean pajamas.            11.  Place clean sheets on your bed the night of your first shower and do not  sleep with pets . Day of Surgery : Do not apply any lotions/deodorants the morning of surgery.  Please wear clean clothes to the hospital/surgery center.   FAILURE TO FOLLOW THESE INSTRUCTIONS MAY RESULT IN THE CANCELLATION OF YOUR SURGERY PATIENT SIGNATURE_________________________________  NURSE SIGNATURE__________________________________  ________________________________________________________________________   Tara Guerra  An incentive spirometer is a tool that can help keep your lungs clear and active. This tool measures how well you are filling your lungs with each breath. Taking long deep breaths may help reverse or decrease the chance of developing breathing (pulmonary) problems (especially infection) following:  A long period of time when you are unable to move or be active. BEFORE THE PROCEDURE   If the spirometer includes an indicator to show your best effort, your nurse or respiratory therapist will set it to a desired goal.  If possible, sit up straight or lean slightly forward. Try not to slouch.  Hold the incentive spirometer in an upright position. INSTRUCTIONS FOR USE  1. Sit on the edge  of your bed if possible, or sit up as far as you can in bed or on a chair. 2. Hold the incentive spirometer in an upright position. 3. Breathe out normally. 4. Place the mouthpiece in your mouth and seal your lips tightly around it. 5. Breathe in slowly and as deeply as possible, raising the piston or the ball toward the top of the column. 6. Hold your breath for 3-5 seconds or for as long as possible. Allow the piston or ball to fall to the bottom of the column. 7. Remove the mouthpiece from your mouth and breathe out normally. 8. Rest for a few seconds and repeat Steps 1 through 7 at least 10 times every 1-2 hours when you are awake. Take your time and take a few normal breaths between deep breaths. 9. The spirometer may include an  indicator to show your best effort. Use the indicator as a goal to work toward during each repetition. 10. After each set of 10 deep breaths, practice coughing to be sure your lungs are clear. If you have an incision (the cut made at the time of surgery), support your incision when coughing by placing a pillow or rolled up towels firmly against it. Once you are able to get out of bed, walk around indoors and cough well. You may stop using the incentive spirometer when instructed by your caregiver.  RISKS AND COMPLICATIONS  Take your time so you do not get dizzy or light-headed.  If you are in pain, you may need to take or ask for pain medication before doing incentive spirometry. It is harder to take a deep breath if you are having pain. AFTER USE  Rest and breathe slowly and easily.  It can be helpful to keep track of a log of your progress. Your caregiver can provide you with a simple table to help with this. If you are using the spirometer at home, follow these instructions: SEEK MEDICAL CARE IF:   You are having difficultly using the spirometer.  You have trouble using the spirometer as often as instructed.  Your pain medication is not giving enough  relief while using the spirometer.  You develop fever of 100.5 F (38.1 C) or higher. SEEK IMMEDIATE MEDICAL CARE IF:   You cough up bloody sputum that had not been present before.  You develop fever of 102 F (38.9 C) or greater.  You develop worsening pain at or near the incision site. MAKE SURE YOU:   Understand these instructions.  Will watch your condition.  Will get help right away if you are not doing well or get worse. Document Released: 07/13/2006 Document Revised: 05/25/2011 Document Reviewed: 09/13/2006 ExitCare Patient Information 2014 [employer], [place].   ________________________________________________________________________  WHAT IS A BLOOD TRANSFUSION? Blood Transfusion Information  A transfusion is the replacement of blood or some of its parts. Blood is made up of multiple cells which provide different functions.  Red blood cells carry oxygen and are used for blood loss replacement.  White blood cells fight against infection.  Platelets control bleeding.  Plasma helps clot blood.  Other blood products are available for specialized needs, such as hemophilia or other clotting disorders. BEFORE THE TRANSFUSION  Who gives blood for transfusions?   Healthy volunteers who are fully evaluated to make sure their blood is safe. This is blood bank blood. Transfusion therapy is the safest it has ever been in the practice of medicine. Before blood is taken from a donor, a complete history is taken to make sure that person has no history of diseases nor engages in risky social behavior (examples are intravenous drug use or sexual activity with multiple partners). The donor's travel history is screened to minimize risk of transmitting infections, such as malaria. The donated blood is tested for signs of infectious diseases, such as HIV and hepatitis. The blood is then tested to be sure it is compatible with you in order to minimize the chance of a transfusion reaction. If  you or a relative donates blood, this is often done in anticipation of surgery and is not appropriate for emergency situations. It takes many days to process the donated blood. RISKS AND COMPLICATIONS Although transfusion therapy is very safe and saves many lives, the main dangers of transfusion include:   Getting an infectious disease.  Developing a transfusion reaction. This is an  allergic reaction to something in the blood you were given. Every precaution is taken to prevent this. The decision to have a blood transfusion has been considered carefully by your caregiver before blood is given. Blood is not given unless the benefits outweigh the risks. AFTER THE TRANSFUSION  Right after receiving a blood transfusion, you will usually feel much better and more energetic. This is especially true if your red blood cells have gotten low (anemic). The transfusion raises the level of the red blood cells which carry oxygen, and this usually causes an energy increase.  The nurse administering the transfusion will monitor you carefully for complications. HOME CARE INSTRUCTIONS  No special instructions are needed after a transfusion. You may find your energy is better. Speak with your caregiver about any limitations on activity for underlying diseases you may have. SEEK MEDICAL CARE IF:   Your condition is not improving after your transfusion.  You develop redness or irritation at the intravenous (IV) site. SEEK IMMEDIATE MEDICAL CARE IF:  Any of the following symptoms occur over the next 12 hours:  Shaking chills.  You have a temperature by mouth above 102 F (38.9 C), not controlled by medicine.  Chest, back, or muscle pain.  People around you feel you are not acting correctly or are confused.  Shortness of breath or difficulty breathing.  Dizziness and fainting.  You get a rash or develop hives.  You have a decrease in urine output.  Your urine turns a dark color or changes to pink,  red, or brown. Any of the following symptoms occur over the next 10 days:  You have a temperature by mouth above 102 F (38.9 C), not controlled by medicine.  Shortness of breath.  Weakness after normal activity.  The white part of the eye turns yellow (jaundice).  You have a decrease in the amount of urine or are urinating less often.  Your urine turns a dark color or changes to pink, red, or brown. Document Released: 02/28/2000 Document Revised: 05/25/2011 Document Reviewed: 10/17/2007 [facility] Patient Information 2014 [place], [place].  _______________________________________________________________________

## 2018-09-22 NOTE — Progress Notes (Signed)
LOV DR Glendale Chard PCP 09-19-18 Epic  EKG 03-30-18 EPIC

## 2018-09-25 NOTE — Progress Notes (Signed)
Subjective:     Patient ID: Tara Guerra , female    DOB: 1945-05-31 , 73 y.o.   MRN: [id number]   Chief Complaint  Patient presents with  . Annual Exam  . Hypertension    HPI  She is here today for a full physical examination. She is no longer followed by GYN. She has no specific concerns or complaints at this time.   Hypertension This is a chronic problem. The current episode started more than 1 year ago. The problem has been gradually improving since onset. The problem is controlled. Pertinent negatives include no blurred vision, chest pain, palpitations or shortness of breath. Risk factors for coronary artery disease include obesity, post-menopausal state and sedentary lifestyle. Past treatments include angiotensin blockers and diuretics. The current treatment provides moderate improvement. Compliance problems include exercise.  Hypertensive end-organ damage includes kidney disease.   She reports compliance with meds.   Past Medical History:  Diagnosis Date  . Hypercholesteremia   . Hypertension      Family History  Problem Relation Age of Onset  . Alzheimer's disease Mother   . Heart attack Father      Current Outpatient Medications:  Marland Kitchen  Menthol, Topical Analgesic, (BENGAY EX), Apply 1 application topically daily as needed (pain)., Disp: , Rfl:  .  olmesartan-hydrochlorothiazide (BENICAR HCT) 40-25 MG per tablet, Take 1 tablet by mouth daily.  , Disp: , Rfl:  .  omeprazole (PRILOSEC) 10 MG capsule, TAKE 1 CAPSULE(10 MG) BY MOUTH DAILY (Patient taking differently: Take 10 mg by mouth daily. ), Disp: 90 capsule, Rfl: 1 .  rosuvastatin (CRESTOR) 10 MG tablet, Take 1 tablet (10 mg total) by mouth at bedtime., Disp: 90 tablet, Rfl: 1 .  traMADol (ULTRAM) 50 MG tablet, TAKE 1 TABLET BY MOUTH EVERY 6 HOURS AS NEEDED (Patient taking differently: Take 50 mg by mouth every 6 (six) hours as needed for moderate pain. ), Disp: 30 tablet, Rfl: 0 .  TURMERIC PO, Take 1,000 mg by  mouth daily., Disp: , Rfl:  .  Vitamin D, Ergocalciferol, (DRISDOL) 1.25 MG (50000 UT) CAPS capsule, Take 5,000 Units by mouth once a week., Disp: , Rfl:    No Known Allergies    The patient states she uses none for birth control. Last LMP was No LMP recorded. Patient is postmenopausal.. Negative for Dysmenorrhea Negative for: breast discharge, breast lump(s), breast pain and breast self exam. Associated symptoms include abnormal vaginal bleeding. Pertinent negatives include abnormal bleeding (hematology), anxiety, decreased libido, depression, difficulty falling sleep, dyspareunia, history of infertility, nocturia, sexual dysfunction, sleep disturbances, urinary incontinence, urinary urgency, vaginal discharge and vaginal itching. Diet regular.The patient states her exercise level is  intermittent.  . The patient's tobacco use is:  Social History   Tobacco Use  Smoking Status Never Smoker  Smokeless Tobacco Never Used  . She has been exposed to passive smoke. The patient's alcohol use is:  Social History   Substance and Sexual Activity  Alcohol Use No    Review of Systems  Constitutional: Negative.   HENT: Negative.   Eyes: Negative.  Negative for blurred vision.  Respiratory: Negative.  Negative for shortness of breath.   Cardiovascular: Negative.  Negative for chest pain and palpitations.  Endocrine: Negative.   Genitourinary: Negative.   Musculoskeletal: Positive for arthralgias.       She has r hip pain. Pain with ambulation. Scheduled for hip replacement. Unfortunately, surgery had to be delayed due to [place].   Skin: Negative.  Allergic/Immunologic: Negative.   Neurological: Negative.   Hematological: Negative.   Psychiatric/Behavioral: Negative.      Today's Vitals   09/19/18 1421  BP: 114/70  Pulse: 80  Temp: 98.9 F (37.2 C)  TempSrc: Oral  Weight: 221 lb (100.2 kg)  Height: 5' 6.2" (1.681 m)   Body mass index is 35.46 kg/m.   Objective:  Physical  Exam Vitals signs and nursing note reviewed.  Constitutional:      Appearance: Normal appearance. She is obese.  HENT:     Head: Normocephalic and atraumatic.     Right Ear: Tympanic membrane, ear canal and external ear normal.     Left Ear: Tympanic membrane, ear canal and external ear normal.     Nose: Nose normal.     Mouth/Throat:     Mouth: Mucous membranes are moist.     Pharynx: Oropharynx is clear.  Eyes:     Extraocular Movements: Extraocular movements intact.     Conjunctiva/sclera: Conjunctivae normal.     Pupils: Pupils are equal, round, and reactive to light.  Neck:     Musculoskeletal: Normal range of motion and neck supple.  Cardiovascular:     Rate and Rhythm: Normal rate and regular rhythm.     Pulses: Normal pulses.     Heart sounds: Normal heart sounds.  Pulmonary:     Effort: Pulmonary effort is normal.     Breath sounds: Normal breath sounds.  Chest:     Breasts:        Right: Normal. No swelling, bleeding, inverted nipple, mass, nipple discharge or skin change.        Left: Normal. No swelling, bleeding, inverted nipple, mass, nipple discharge or skin change.  Abdominal:     General: Bowel sounds are normal.     Palpations: Abdomen is soft.     Comments: obese  Genitourinary:    Comments: deferred Musculoskeletal: Normal range of motion.     Comments: Ambulatory with cane Pain with movement of right hip  Skin:    General: Skin is warm and dry.  Neurological:     General: No focal deficit present.     Mental Status: She is alert and oriented to person, place, and time.  Psychiatric:        Mood and Affect: Mood normal.        Behavior: Behavior normal.         Assessment And Plan:     1. Routine general medical examination at health care facility  A full exam was performed.  Importance of monthly self breast exams was discussed with the patient.  PATIENT HAS BEEN ADVISED TO GET 30-45 MINUTES REGULAR EXERCISE NO LESS THAN FOUR TO FIVE DAYS PER  WEEK - BOTH WEIGHTBEARING EXERCISES AND AEROBIC ARE RECOMMENDED.  SHE IS ADVISED TO FOLLOW A HEALTHY DIET WITH AT LEAST SIX FRUITS/VEGGIES PER DAY, DECREASE INTAKE OF RED MEAT, AND TO INCREASE FISH INTAKE TO TWO DAYS PER WEEK.  MEATS/FISH SHOULD NOT BE FRIED, BAKED OR BROILED IS PREFERABLE.  I SUGGEST WEARING SPF 50 SUNSCREEN ON EXPOSED PARTS AND ESPECIALLY WHEN IN THE DIRECT SUNLIGHT FOR AN EXTENDED PERIOD OF TIME.  PLEASE AVOID FAST FOOD RESTAURANTS AND INCREASE YOUR WATER INTAKE.   2. Hypertensive nephropathy  Well controlled, she will continue with current meds. She is encouraged to avoid adding salt to her foods. EKG not performed today, one was done earlier this year. She will rto in six months for re-evaluation.   -  CMP14+EGFR - CBC - Lipid panel - POCT Urinalysis Dipstick (81002) - POCT UA - Microalbumin  3. Chronic renal disease, stage III (HCC)  Chronic I will check a GFR, Cr today. She is encouraged to stay well hydrated.    4. Estrogen deficiency  I will refer her for a bone density. Her most recent result was reviewed in full detail. Importance of calcium, vitamin d supplementation and participation in weight-bearing exercises was discussed with the patient.   - DG Bone Density; Future  5. Pre-diabetes  HER A1C HAS BEEN ELEVATED IN THE PAST. I WILL CHECK AN A1C, BMET TODAY. SHE WAS ENCOURAGED TO AVOID SUGARY BEVERAGES AND PROCESSED FOODS INCLUDNG BREADS, RICE AND PASTA.  - Hemoglobin A1c  6. Class 2 severe obesity due to excess calories with serious comorbidity and body mass index (BMI) of 35.0 to 35.9 in adult [facility])  Importance of achieving optimal weight to decrease risk of cardiovascular disease and cancers was discussed with the patient in full detail. She is encouraged to start slowly - start with 10 minutes twice daily at least three to four days per week and to gradually build to 30 minutes five days weekly. She was given tips to incorporate more activity into her  daily routine - take stairs when possible, park farther away from grocery stores, etc.    7. Right hip pain  She is scheduled for right hip replacement on July 10th. Pt encouraged to participate in physical therapy fully. She is encourage to contact me should she have any medical issues arise post-operatively.   Maximino Greenland, MD    THE PATIENT IS ENCOURAGED TO PRACTICE SOCIAL DISTANCING DUE TO THE COVID-19 PANDEMIC.

## 2018-09-26 ENCOUNTER — Encounter (HOSPITAL_COMMUNITY): Payer: Self-pay

## 2018-09-26 ENCOUNTER — Encounter (HOSPITAL_COMMUNITY)
Admission: RE | Admit: 2018-09-26 | Discharge: 2018-09-26 | Disposition: A | Discharge status: Home / Self Care | Payer: Medicare Other | Source: Ambulatory Visit | Attending: Orthopedic Surgery | Admitting: Orthopedic Surgery

## 2018-09-26 ENCOUNTER — Other Ambulatory Visit: Payer: Self-pay

## 2018-09-26 ENCOUNTER — Other Ambulatory Visit (HOSPITAL_COMMUNITY)
Admission: RE | Admit: 2018-09-26 | Discharge: 2018-09-26 | Disposition: A | Discharge status: Home / Self Care | Payer: Medicare Other | Source: Ambulatory Visit | Attending: Orthopedic Surgery | Admitting: Orthopedic Surgery

## 2018-09-26 DIAGNOSIS — N189 Chronic kidney disease, unspecified: Secondary | ICD-10-CM | POA: Insufficient documentation

## 2018-09-26 DIAGNOSIS — M1611 Unilateral primary osteoarthritis, right hip: Secondary | ICD-10-CM | POA: Insufficient documentation

## 2018-09-26 DIAGNOSIS — E7800 Pure hypercholesterolemia, unspecified: Secondary | ICD-10-CM | POA: Insufficient documentation

## 2018-09-26 DIAGNOSIS — M25551 Pain in right hip: Secondary | ICD-10-CM | POA: Diagnosis present

## 2018-09-26 DIAGNOSIS — N183 Chronic kidney disease, stage 3 (moderate): Secondary | ICD-10-CM | POA: Diagnosis not present

## 2018-09-26 DIAGNOSIS — I129 Hypertensive chronic kidney disease with stage 1 through stage 4 chronic kidney disease, or unspecified chronic kidney disease: Secondary | ICD-10-CM | POA: Insufficient documentation

## 2018-09-26 DIAGNOSIS — Z1159 Encounter for screening for other viral diseases: Secondary | ICD-10-CM | POA: Insufficient documentation

## 2018-09-26 DIAGNOSIS — Z01818 Encounter for other preprocedural examination: Secondary | ICD-10-CM | POA: Insufficient documentation

## 2018-09-26 DIAGNOSIS — Z79899 Other long term (current) drug therapy: Secondary | ICD-10-CM | POA: Insufficient documentation

## 2018-09-26 HISTORY — DX: Unspecified osteoarthritis, unspecified site: M19.90

## 2018-09-26 HISTORY — DX: Chronic kidney disease, unspecified: N18.9

## 2018-09-26 LAB — COMPREHENSIVE METABOLIC PANEL
ALT: 13 U/L (ref 0–44)
AST: 15 U/L (ref 15–41)
Albumin: 4.4 g/dL (ref 3.5–5.0)
Alkaline Phosphatase: 69 U/L (ref 38–126)
Anion gap: 12 (ref 5–15)
BUN: 25 mg/dL — ABNORMAL HIGH (ref 8–23)
CO2: 24 mmol/L (ref 22–32)
Calcium: 9.5 mg/dL (ref 8.9–10.3)
Chloride: 104 mmol/L (ref 98–111)
Creatinine, Ser: 1.37 mg/dL — ABNORMAL HIGH (ref 0.44–1.00)
GFR calc Af Amer: 44 mL/min — ABNORMAL LOW (ref >60)
GFR calc non Af Amer: 38 mL/min — ABNORMAL LOW (ref >60)
Glucose, Bld: 89 mg/dL (ref 70–99)
Potassium: 3.6 mmol/L (ref 3.5–5.1)
Sodium: 140 mmol/L (ref 135–145)
Total Bilirubin: 0.8 mg/dL (ref 0.3–1.2)
Total Protein: 8.5 g/dL — ABNORMAL HIGH (ref 6.5–8.1)

## 2018-09-26 LAB — URINALYSIS, ROUTINE W REFLEX MICROSCOPIC
Bilirubin Urine: NEGATIVE
Glucose, UA: NEGATIVE mg/dL
Hgb urine dipstick: NEGATIVE
Ketones, ur: NEGATIVE mg/dL
Nitrite: NEGATIVE
Protein, ur: NEGATIVE mg/dL
Specific Gravity, Urine: 1.005 (ref 1.005–1.030)
pH: 6 (ref 5.0–8.0)

## 2018-09-26 LAB — CBC
HCT: 42.8 % (ref 36.0–46.0)
Hemoglobin: 13.2 g/dL (ref 12.0–15.0)
MCH: 27.3 pg (ref 26.0–34.0)
MCHC: 30.8 g/dL (ref 30.0–36.0)
MCV: 88.4 fL (ref 80.0–100.0)
Platelets: 258 10*3/uL (ref 150–400)
RBC: 4.84 MIL/uL (ref 3.87–5.11)
RDW: 15.8 % — ABNORMAL HIGH (ref 11.5–15.5)
WBC: 5.3 10*3/uL (ref 4.0–10.5)
nRBC: 0 % (ref 0.0–0.2)

## 2018-09-26 LAB — PROTIME-INR
INR: 1.1 (ref 0.8–1.2)
Prothrombin Time: 13.6 seconds (ref 11.4–15.2)

## 2018-09-26 LAB — ABO/RH: ABO/RH(D): O POS

## 2018-09-26 LAB — SURGICAL PCR SCREEN
MRSA, PCR: NEGATIVE
Staphylococcus aureus: NEGATIVE

## 2018-09-26 NOTE — Progress Notes (Addendum)
Anesthesia   Cardiac clearance in on chart .  Cardiologist is Dr. Terrence Dupont.  Kidney Dr. Is Dr. Lawson Radar Labs in Epic see BUN and Creat. Cardiac clearance is on the cahrt

## 2018-09-27 LAB — SARS CORONAVIRUS 2 (TAT 6-24 HRS): SARS Coronavirus 2: NEGATIVE

## 2018-09-27 NOTE — Anesthesia Preprocedure Evaluation (Addendum)
Anesthesia Evaluation  Patient identified by MRN, date of birth, ID band Patient awake    Reviewed: Allergy & Precautions, NPO status , Patient's Chart, lab work & pertinent test results  Airway Mallampati: II  TM Distance: >3 FB Neck ROM: Full    Dental  (+) Upper Dentures, Dental Advisory Given   Pulmonary neg pulmonary ROS,    breath sounds clear to auscultation       Cardiovascular hypertension,  Rhythm:Regular Rate:Normal     Neuro/Psych negative neurological ROS     GI/Hepatic negative GI ROS, Neg liver ROS,   Endo/Other    Renal/GU CRFRenal disease     Musculoskeletal  (+) Arthritis ,   Abdominal (+) + obese,   Peds  Hematology   Anesthesia Other Findings   Reproductive/Obstetrics                           Lab Results  Component Value Date   WBC 5.3 09/26/2018   HGB 13.2 09/26/2018   HCT 42.8 09/26/2018   MCV 88.4 09/26/2018   PLT 258 09/26/2018   Lab Results  Component Value Date   INR 1.1 09/26/2018     Anesthesia Physical Anesthesia Plan  ASA: II  Anesthesia Plan: Spinal   Post-op Pain Management:    Induction: Intravenous  PONV Risk Score and Plan: 3 and Ondansetron, Dexamethasone and Propofol infusion  Airway Management Planned: Simple Face Mask  Additional Equipment: None  Intra-op Plan:   Post-operative Plan:   Informed Consent: I have reviewed the patients History and Physical, chart, labs and discussed the procedure including the risks, benefits and alternatives for the proposed anesthesia with the patient or authorized representative who has indicated his/her understanding and acceptance.       Plan Discussed with: CRNA  Anesthesia Plan Comments: (See APP note by Willeen Cass, FNP)      Anesthesia Quick Evaluation

## 2018-09-27 NOTE — Progress Notes (Signed)
Anesthesia Chart Review:   Case: [id number] Date/Time: 09/29/18 1300   Procedure: TOTAL HIP ARTHROPLASTY ANTERIOR APPROACH (Right )   Anesthesia type: Spinal   Pre-op diagnosis: Degenerative joint disease right hip   Location: [facility] ROOM 09 / [facility]   Surgeon: Rod Can, MD      DISCUSSION:  Pt is a 73 year old female with hx HTN, CKD  - Pt has PCP and cardiology clearance for surgery.    VS: BP 131/73   Pulse 73   Temp 37 C (Oral)   Resp 16   Ht 5\' 6"  (1.676 m)   Wt 99.7 kg   SpO2 98%   BMI 35.46 kg/m    PROVIDERS: - PCP is Glendale Chard, MD who has cleared pt for surgery (see media tab in Epic) - Cardiologist is Charolette Forward, MD who sees pt for HTN. Pt cleared for surgery at last office visit 08/23/18 (copy of OV note in media tab) - Nephrologist is Lawson Radar, MD (copy of OV note 07/28/18 in media tab)   LABS: Labs reviewed: Acceptable for surgery. (all labs ordered are listed, but only abnormal results are displayed)  Labs Reviewed  CBC - Abnormal; Notable for the following components:      Result Value   RDW 15.8 (*)    All other components within normal limits  COMPREHENSIVE METABOLIC PANEL - Abnormal; Notable for the following components:   BUN 25 (*)    Creatinine, Ser 1.37 (*)    Total Protein 8.5 (*)    GFR calc non Af Amer 38 (*)    GFR calc Af Amer 44 (*)    All other components within normal limits  URINALYSIS, ROUTINE W REFLEX MICROSCOPIC - Abnormal; Notable for the following components:   Color, Urine STRAW (*)    Leukocytes,Ua TRACE (*)    Bacteria, UA RARE (*)    All other components within normal limits  SURGICAL PCR SCREEN  PROTIME-INR  TYPE AND SCREEN  ABO/RH    EKG 03/30/18: sinus rhythm   CV:  Nuclear stress test 11/18/14:  1. No reversible ischemia or infarction. 2. Normal left ventricular wall motion. 3. Left ventricular ejection fraction 68% 4. Low-risk stress test findings   Past Medical History:  Diagnosis Date  .  Arthritis    hips andhands , Knees  . Chronic kidney disease   . Hypercholesteremia   . Hypertension     Past Surgical History:  Procedure Laterality Date  . COLONOSCOPY  2019  . VASCULAR SURGERY Right    Varicose veins stripping    MEDICATIONS: . Menthol, Topical Analgesic, (BENGAY EX)  . olmesartan-hydrochlorothiazide (BENICAR HCT) 40-25 MG per tablet  . omeprazole (PRILOSEC) 10 MG capsule  . rosuvastatin (CRESTOR) 10 MG tablet  . traMADol (ULTRAM) 50 MG tablet  . TURMERIC PO  . Vitamin D, Ergocalciferol, (DRISDOL) 1.25 MG (50000 UT) CAPS capsule   No current facility-administered medications for this encounter.     If no changes, I anticipate pt can proceed with surgery as scheduled.   Willeen Cass, FNP-BC [facility]/Anesthesiology Phone: [phone] 09/27/2018 11:07 AM

## 2018-09-28 ENCOUNTER — Encounter (HOSPITAL_COMMUNITY): Payer: Self-pay | Admitting: Anesthesiology

## 2018-09-28 NOTE — Progress Notes (Signed)
Patient informed of time change for surgery. To arrive 0800 for 1015 surgery on 09/29/18. Ensure drink at 0715 prior to surgery. She verbalizes understanding.

## 2018-09-29 ENCOUNTER — Ambulatory Visit (HOSPITAL_COMMUNITY): Payer: Medicare Other | Admitting: Emergency Medicine

## 2018-09-29 ENCOUNTER — Encounter (HOSPITAL_COMMUNITY)
Admission: RE | Disposition: A | Discharge status: Home / Self Care | Payer: Self-pay | Source: Home / Self Care | Attending: Orthopedic Surgery

## 2018-09-29 ENCOUNTER — Ambulatory Visit (HOSPITAL_COMMUNITY): Payer: Medicare Other

## 2018-09-29 ENCOUNTER — Ambulatory Visit (HOSPITAL_COMMUNITY): Payer: Medicare Other | Admitting: Certified Registered Nurse Anesthetist

## 2018-09-29 ENCOUNTER — Ambulatory Visit (HOSPITAL_COMMUNITY)
Admission: RE | Admit: 2018-09-29 | Discharge: 2018-10-01 | Disposition: A | Discharge status: Home / Self Care | Payer: Medicare Other | Attending: Orthopedic Surgery | Admitting: Orthopedic Surgery

## 2018-09-29 ENCOUNTER — Other Ambulatory Visit: Payer: Self-pay

## 2018-09-29 ENCOUNTER — Encounter (HOSPITAL_COMMUNITY): Payer: Self-pay | Admitting: Certified Registered"

## 2018-09-29 DIAGNOSIS — N183 Chronic kidney disease, stage 3 (moderate): Secondary | ICD-10-CM | POA: Diagnosis not present

## 2018-09-29 DIAGNOSIS — E7800 Pure hypercholesterolemia, unspecified: Secondary | ICD-10-CM | POA: Diagnosis not present

## 2018-09-29 DIAGNOSIS — Z96641 Presence of right artificial hip joint: Secondary | ICD-10-CM | POA: Diagnosis not present

## 2018-09-29 DIAGNOSIS — Z1159 Encounter for screening for other viral diseases: Secondary | ICD-10-CM | POA: Insufficient documentation

## 2018-09-29 DIAGNOSIS — I129 Hypertensive chronic kidney disease with stage 1 through stage 4 chronic kidney disease, or unspecified chronic kidney disease: Secondary | ICD-10-CM | POA: Diagnosis not present

## 2018-09-29 DIAGNOSIS — M1611 Unilateral primary osteoarthritis, right hip: Secondary | ICD-10-CM | POA: Diagnosis not present

## 2018-09-29 DIAGNOSIS — N189 Chronic kidney disease, unspecified: Secondary | ICD-10-CM | POA: Diagnosis not present

## 2018-09-29 DIAGNOSIS — Z79899 Other long term (current) drug therapy: Secondary | ICD-10-CM | POA: Insufficient documentation

## 2018-09-29 DIAGNOSIS — Z09 Encounter for follow-up examination after completed treatment for conditions other than malignant neoplasm: Secondary | ICD-10-CM

## 2018-09-29 DIAGNOSIS — Z419 Encounter for procedure for purposes other than remedying health state, unspecified: Secondary | ICD-10-CM

## 2018-09-29 DIAGNOSIS — I10 Essential (primary) hypertension: Secondary | ICD-10-CM | POA: Diagnosis not present

## 2018-09-29 DIAGNOSIS — Z471 Aftercare following joint replacement surgery: Secondary | ICD-10-CM | POA: Diagnosis not present

## 2018-09-29 HISTORY — PX: TOTAL HIP ARTHROPLASTY: SHX124

## 2018-09-29 LAB — TYPE AND SCREEN
ABO/RH(D): O POS
Antibody Screen: NEGATIVE

## 2018-09-29 SURGERY — ARTHROPLASTY, HIP, TOTAL, ANTERIOR APPROACH
Anesthesia: Spinal | Laterality: Right

## 2018-09-29 MED ORDER — BUPIVACAINE-EPINEPHRINE (PF) 0.25% -1:200000 IJ SOLN
INTRAMUSCULAR | Status: AC
  Filled 2018-09-29: qty 30

## 2018-09-29 MED ORDER — CHLORHEXIDINE GLUCONATE 4 % EX LIQD: 60.0000 mL | Freq: Once | CUTANEOUS | Status: DC

## 2018-09-29 MED ORDER — PROPOFOL 10 MG/ML IV BOLUS
INTRAVENOUS | Status: AC
  Filled 2018-09-29: qty 20

## 2018-09-29 MED ORDER — METHOCARBAMOL 500 MG PO TABS
500.0000 mg | ORAL_TABLET | Freq: Four times a day (QID) | ORAL | Status: DC | PRN
  Administered 2018-09-29 – 2018-09-30 (×3): 500 mg via ORAL
  Filled 2018-09-29 (×3): qty 1

## 2018-09-29 MED ORDER — CELECOXIB 200 MG PO CAPS
200.0000 mg | ORAL_CAPSULE | Freq: Two times a day (BID) | ORAL | Status: DC
  Administered 2018-09-29 – 2018-10-01 (×4): 200 mg via ORAL
  Filled 2018-09-29 (×4): qty 1

## 2018-09-29 MED ORDER — METHOCARBAMOL 500 MG IVPB - SIMPLE MED
INTRAVENOUS | Status: AC
  Filled 2018-09-29: qty 50

## 2018-09-29 MED ORDER — FENTANYL CITRATE (PF) 100 MCG/2ML IJ SOLN
INTRAMUSCULAR | Status: DC | PRN
  Administered 2018-09-29: 100 ug via INTRAVENOUS

## 2018-09-29 MED ORDER — PHENYLEPHRINE 40 MCG/ML (10ML) SYRINGE FOR IV PUSH (FOR BLOOD PRESSURE SUPPORT)
PREFILLED_SYRINGE | INTRAVENOUS | Status: AC
  Filled 2018-09-29: qty 10

## 2018-09-29 MED ORDER — PROPOFOL 500 MG/50ML IV EMUL
INTRAVENOUS | Status: DC | PRN
  Administered 2018-09-29: 75 ug/kg/min via INTRAVENOUS

## 2018-09-29 MED ORDER — KETOROLAC TROMETHAMINE 30 MG/ML IJ SOLN
INTRAMUSCULAR | Status: DC | PRN
  Administered 2018-09-29: 30 mg via INTRAVENOUS

## 2018-09-29 MED ORDER — POLYETHYLENE GLYCOL 3350 17 G PO PACK: 17.0000 g | PACK | Freq: Every day | ORAL | Status: DC | PRN

## 2018-09-29 MED ORDER — HYDROCODONE-ACETAMINOPHEN 5-325 MG PO TABS
1.0000 | ORAL_TABLET | ORAL | Status: DC | PRN
  Administered 2018-09-29: 2 via ORAL
  Administered 2018-09-29: 1 via ORAL
  Administered 2018-09-30 – 2018-10-01 (×4): 2 via ORAL
  Filled 2018-09-29 (×6): qty 2

## 2018-09-29 MED ORDER — PANTOPRAZOLE SODIUM 40 MG PO TBEC
40.0000 mg | DELAYED_RELEASE_TABLET | Freq: Every day | ORAL | Status: DC
  Administered 2018-09-29 – 2018-10-01 (×3): 40 mg via ORAL
  Filled 2018-09-29 (×3): qty 1

## 2018-09-29 MED ORDER — ACETAMINOPHEN 325 MG PO TABS: 325.0000 mg | ORAL_TABLET | Freq: Once | ORAL | Status: DC | PRN

## 2018-09-29 MED ORDER — FENTANYL CITRATE (PF) 100 MCG/2ML IJ SOLN: 25.0000 ug | INTRAMUSCULAR | Status: DC | PRN

## 2018-09-29 MED ORDER — PHENOL 1.4 % MT LIQD: 1.0000 | OROMUCOSAL | Status: DC | PRN

## 2018-09-29 MED ORDER — HYDROCODONE-ACETAMINOPHEN 7.5-325 MG PO TABS: 1.0000 | ORAL_TABLET | ORAL | Status: DC | PRN

## 2018-09-29 MED ORDER — OLMESARTAN MEDOXOMIL-HCTZ 40-25 MG PO TABS: 1.0000 | ORAL_TABLET | Freq: Every day | ORAL | Status: DC

## 2018-09-29 MED ORDER — PHENYLEPHRINE 40 MCG/ML (10ML) SYRINGE FOR IV PUSH (FOR BLOOD PRESSURE SUPPORT)
PREFILLED_SYRINGE | INTRAVENOUS | Status: DC | PRN
  Administered 2018-09-29 (×9): 80 ug via INTRAVENOUS

## 2018-09-29 MED ORDER — ACETAMINOPHEN 10 MG/ML IV SOLN: 1000.0000 mg | Freq: Once | INTRAVENOUS | Status: DC | PRN

## 2018-09-29 MED ORDER — ROSUVASTATIN CALCIUM 10 MG PO TABS
10.0000 mg | ORAL_TABLET | Freq: Every day | ORAL | Status: DC
  Administered 2018-09-29 – 2018-09-30 (×2): 10 mg via ORAL
  Filled 2018-09-29 (×2): qty 1

## 2018-09-29 MED ORDER — ACETAMINOPHEN 10 MG/ML IV SOLN
1000.0000 mg | INTRAVENOUS | Status: AC
  Administered 2018-09-29: 1000 mg via INTRAVENOUS
  Filled 2018-09-29: qty 100

## 2018-09-29 MED ORDER — ONDANSETRON HCL 4 MG/2ML IJ SOLN
INTRAMUSCULAR | Status: DC | PRN
  Administered 2018-09-29: 4 mg via INTRAVENOUS

## 2018-09-29 MED ORDER — HYDROCHLOROTHIAZIDE 25 MG PO TABS
25.0000 mg | ORAL_TABLET | Freq: Every day | ORAL | Status: DC
  Administered 2018-09-30 – 2018-10-01 (×2): 25 mg via ORAL
  Filled 2018-09-29 (×2): qty 1

## 2018-09-29 MED ORDER — PROMETHAZINE HCL 25 MG/ML IJ SOLN: 6.2500 mg | INTRAMUSCULAR | Status: DC | PRN

## 2018-09-29 MED ORDER — MIDAZOLAM HCL 2 MG/2ML IJ SOLN
INTRAMUSCULAR | Status: AC
  Filled 2018-09-29: qty 2

## 2018-09-29 MED ORDER — MENTHOL 3 MG MT LOZG: 1.0000 | LOZENGE | OROMUCOSAL | Status: DC | PRN

## 2018-09-29 MED ORDER — METOCLOPRAMIDE HCL 5 MG/ML IJ SOLN: 5.0000 mg | Freq: Three times a day (TID) | INTRAMUSCULAR | Status: DC | PRN

## 2018-09-29 MED ORDER — SODIUM CHLORIDE 0.9 % IV SOLN
INTRAVENOUS | Status: DC
  Administered 2018-09-29 (×3): via INTRAVENOUS

## 2018-09-29 MED ORDER — SODIUM CHLORIDE 0.9 % IV SOLN: INTRAVENOUS | Status: DC

## 2018-09-29 MED ORDER — FENTANYL CITRATE (PF) 100 MCG/2ML IJ SOLN
INTRAMUSCULAR | Status: AC
  Filled 2018-09-29: qty 2

## 2018-09-29 MED ORDER — IRBESARTAN 150 MG PO TABS
300.0000 mg | ORAL_TABLET | Freq: Every day | ORAL | Status: DC
  Administered 2018-09-30 – 2018-10-01 (×2): 300 mg via ORAL
  Filled 2018-09-29 (×2): qty 2

## 2018-09-29 MED ORDER — MIDAZOLAM HCL 5 MG/5ML IJ SOLN
INTRAMUSCULAR | Status: DC | PRN
  Administered 2018-09-29: 2 mg via INTRAVENOUS

## 2018-09-29 MED ORDER — METOCLOPRAMIDE HCL 5 MG PO TABS: 5.0000 mg | ORAL_TABLET | Freq: Three times a day (TID) | ORAL | Status: DC | PRN

## 2018-09-29 MED ORDER — ONDANSETRON HCL 4 MG PO TABS: 4.0000 mg | ORAL_TABLET | Freq: Four times a day (QID) | ORAL | Status: DC | PRN

## 2018-09-29 MED ORDER — EPHEDRINE 5 MG/ML INJ
INTRAVENOUS | Status: AC
  Filled 2018-09-29: qty 10

## 2018-09-29 MED ORDER — POVIDONE-IODINE 10 % EX SWAB
2.0000 "application " | Freq: Once | CUTANEOUS | Status: AC
  Administered 2018-09-29: 2 via TOPICAL

## 2018-09-29 MED ORDER — SODIUM CHLORIDE 0.9 % IR SOLN
Status: DC | PRN
  Administered 2018-09-29 (×2): 1000 mL

## 2018-09-29 MED ORDER — ISOPROPYL ALCOHOL 70 % SOLN
Status: DC | PRN
  Administered 2018-09-29: 1 via TOPICAL

## 2018-09-29 MED ORDER — LACTATED RINGERS IV SOLN
INTRAVENOUS | Status: DC
  Administered 2018-09-29 (×3): via INTRAVENOUS

## 2018-09-29 MED ORDER — MORPHINE SULFATE (PF) 2 MG/ML IV SOLN: 0.5000 mg | INTRAVENOUS | Status: DC | PRN

## 2018-09-29 MED ORDER — ASPIRIN 81 MG PO CHEW
81.0000 mg | CHEWABLE_TABLET | Freq: Two times a day (BID) | ORAL | Status: DC
  Administered 2018-09-29 – 2018-10-01 (×4): 81 mg via ORAL
  Filled 2018-09-29 (×4): qty 1

## 2018-09-29 MED ORDER — ACETAMINOPHEN 160 MG/5ML PO SOLN: 325.0000 mg | Freq: Once | ORAL | Status: DC | PRN

## 2018-09-29 MED ORDER — MEPERIDINE HCL 50 MG/ML IJ SOLN: 6.2500 mg | INTRAMUSCULAR | Status: DC | PRN

## 2018-09-29 MED ORDER — ACETAMINOPHEN 325 MG PO TABS: 325.0000 mg | ORAL_TABLET | Freq: Four times a day (QID) | ORAL | Status: DC | PRN

## 2018-09-29 MED ORDER — SODIUM CHLORIDE (PF) 0.9 % IJ SOLN
INTRAMUSCULAR | Status: DC | PRN
  Administered 2018-09-29: 50 mL via INTRAVENOUS

## 2018-09-29 MED ORDER — CEFAZOLIN SODIUM-DEXTROSE 2-4 GM/100ML-% IV SOLN
2.0000 g | INTRAVENOUS | Status: AC
  Administered 2018-09-29: 10:00:00 2 g via INTRAVENOUS
  Filled 2018-09-29: qty 100

## 2018-09-29 MED ORDER — DOCUSATE SODIUM 100 MG PO CAPS
100.0000 mg | ORAL_CAPSULE | Freq: Two times a day (BID) | ORAL | Status: DC
  Administered 2018-09-29 – 2018-10-01 (×4): 100 mg via ORAL
  Filled 2018-09-29 (×4): qty 1

## 2018-09-29 MED ORDER — SENNA 8.6 MG PO TABS
1.0000 | ORAL_TABLET | Freq: Two times a day (BID) | ORAL | Status: DC
  Administered 2018-09-29 – 2018-10-01 (×4): 8.6 mg via ORAL
  Filled 2018-09-29 (×4): qty 1

## 2018-09-29 MED ORDER — SODIUM CHLORIDE (PF) 0.9 % IJ SOLN
INTRAMUSCULAR | Status: AC
  Filled 2018-09-29: qty 50

## 2018-09-29 MED ORDER — ALUM & MAG HYDROXIDE-SIMETH 200-200-20 MG/5ML PO SUSP: 30.0000 mL | ORAL | Status: DC | PRN

## 2018-09-29 MED ORDER — ONDANSETRON HCL 4 MG/2ML IJ SOLN: 4.0000 mg | Freq: Four times a day (QID) | INTRAMUSCULAR | Status: DC | PRN

## 2018-09-29 MED ORDER — LACTATED RINGERS IV SOLN: INTRAVENOUS | Status: DC

## 2018-09-29 MED ORDER — KETOROLAC TROMETHAMINE 30 MG/ML IJ SOLN
INTRAMUSCULAR | Status: AC
  Filled 2018-09-29: qty 1

## 2018-09-29 MED ORDER — CEFAZOLIN SODIUM-DEXTROSE 2-4 GM/100ML-% IV SOLN
2.0000 g | Freq: Four times a day (QID) | INTRAVENOUS | Status: AC
  Administered 2018-09-29 (×2): 2 g via INTRAVENOUS
  Filled 2018-09-29 (×2): qty 100

## 2018-09-29 MED ORDER — METHOCARBAMOL 500 MG IVPB - SIMPLE MED
500.0000 mg | Freq: Four times a day (QID) | INTRAVENOUS | Status: DC | PRN
  Administered 2018-09-29: 500 mg via INTRAVENOUS
  Filled 2018-09-29: qty 50

## 2018-09-29 MED ORDER — PROPOFOL 10 MG/ML IV BOLUS
INTRAVENOUS | Status: AC
  Filled 2018-09-29: qty 40

## 2018-09-29 MED ORDER — BUPIVACAINE-EPINEPHRINE 0.25% -1:200000 IJ SOLN
INTRAMUSCULAR | Status: DC | PRN
  Administered 2018-09-29: 30 mL

## 2018-09-29 MED ORDER — DEXAMETHASONE SODIUM PHOSPHATE 10 MG/ML IJ SOLN
INTRAMUSCULAR | Status: DC | PRN
  Administered 2018-09-29: 10 mg via INTRAVENOUS

## 2018-09-29 MED ORDER — TRANEXAMIC ACID-NACL 1000-0.7 MG/100ML-% IV SOLN
1000.0000 mg | INTRAVENOUS | Status: AC
  Administered 2018-09-29: 11:00:00 1000 mg via INTRAVENOUS
  Filled 2018-09-29: qty 100

## 2018-09-29 MED ORDER — DEXAMETHASONE SODIUM PHOSPHATE 10 MG/ML IJ SOLN
10.0000 mg | Freq: Once | INTRAMUSCULAR | Status: AC
  Administered 2018-09-30: 10 mg via INTRAVENOUS
  Filled 2018-09-29: qty 1

## 2018-09-29 MED ORDER — EPHEDRINE SULFATE-NACL 50-0.9 MG/10ML-% IV SOSY
PREFILLED_SYRINGE | INTRAVENOUS | Status: DC | PRN
  Administered 2018-09-29 (×3): 10 mg via INTRAVENOUS

## 2018-09-29 MED ORDER — POVIDONE-IODINE 10 % EX SWAB: 2.0000 "application " | Freq: Once | CUTANEOUS | Status: DC

## 2018-09-29 MED ORDER — BUPIVACAINE IN DEXTROSE 0.75-8.25 % IT SOLN
INTRATHECAL | Status: DC | PRN
  Administered 2018-09-29: 2 mL via INTRATHECAL

## 2018-09-29 MED ORDER — DIPHENHYDRAMINE HCL 12.5 MG/5ML PO ELIX: 12.5000 mg | ORAL_SOLUTION | ORAL | Status: DC | PRN

## 2018-09-29 SURGICAL SUPPLY — 60 items
ADH SKN CLS APL DERMABOND .7 (GAUZE/BANDAGES/DRESSINGS) ×1
APL PRP STRL LF DISP 70% ISPRP (MISCELLANEOUS) ×1
BAG DECANTER FOR FLEXI CONT (MISCELLANEOUS) IMPLANT
BAG SPEC THK2 15X12 ZIP CLS (MISCELLANEOUS)
BAG ZIPLOCK 12X15 (MISCELLANEOUS) IMPLANT
BALL HIP CERAMIC (Hips) IMPLANT
BLADE SURG SZ10 CARB STEEL (BLADE) IMPLANT
CHLORAPREP W/TINT 26 (MISCELLANEOUS) ×2 IMPLANT
COVER PERINEAL POST (MISCELLANEOUS) ×2 IMPLANT
COVER SURGICAL LIGHT HANDLE (MISCELLANEOUS) ×2 IMPLANT
COVER WAND RF STERILE (DRAPES) IMPLANT
DECANTER SPIKE VIAL GLASS SM (MISCELLANEOUS) ×2 IMPLANT
DERMABOND ADVANCED (GAUZE/BANDAGES/DRESSINGS) ×1
DERMABOND ADVANCED .7 DNX12 (GAUZE/BANDAGES/DRESSINGS) ×2 IMPLANT
DRAPE IMP U-DRAPE 54X76 (DRAPES) ×2 IMPLANT
DRAPE SHEET LG 3/4 BI-LAMINATE (DRAPES) ×6 IMPLANT
DRAPE U-SHAPE 47X51 STRL (DRAPES) ×4 IMPLANT
DRSG AQUACEL AG ADV 3.5X10 (GAUZE/BANDAGES/DRESSINGS) ×2 IMPLANT
ELECT PENCIL ROCKER SW 15FT (MISCELLANEOUS) ×2 IMPLANT
ELECT REM PT RETURN 15FT ADLT (MISCELLANEOUS) ×2 IMPLANT
GAUZE SPONGE 4X4 12PLY STRL (GAUZE/BANDAGES/DRESSINGS) ×2 IMPLANT
GLOVE BIO SURGEON STRL SZ8.5 (GLOVE) ×4 IMPLANT
GLOVE BIOGEL PI IND STRL 8.5 (GLOVE) ×1 IMPLANT
GLOVE BIOGEL PI INDICATOR 8.5 (GLOVE) ×1
GOWN SPEC L3 XXLG W/TWL (GOWN DISPOSABLE) ×2 IMPLANT
HANDPIECE INTERPULSE COAX TIP (DISPOSABLE) ×2
HIP BALL CERAMIC (Hips) ×2 IMPLANT
HOLDER FOLEY CATH W/STRAP (MISCELLANEOUS) ×2 IMPLANT
HOOD PEEL AWAY FLYTE STAYCOOL (MISCELLANEOUS) ×8 IMPLANT
JET LAVAGE IRRISEPT WOUND (IRRIGATION / IRRIGATOR) ×2
KIT TURNOVER KIT A (KITS) ×1 IMPLANT
LAVAGE JET IRRISEPT WOUND (IRRIGATION / IRRIGATOR) ×1 IMPLANT
LINER PINN ACET NEUT 32X52 ×1 IMPLANT
MANIFOLD NEPTUNE II (INSTRUMENTS) ×2 IMPLANT
MARKER SKIN DUAL TIP RULER LAB (MISCELLANEOUS) ×2 IMPLANT
NDL SAFETY ECLIPSE 18X1.5 (NEEDLE) ×1 IMPLANT
NDL SPNL 18GX3.5 QUINCKE PK (NEEDLE) ×1 IMPLANT
NEEDLE HYPO 18GX1.5 SHARP (NEEDLE) ×2
NEEDLE SPNL 18GX3.5 QUINCKE PK (NEEDLE) ×2 IMPLANT
PACK ANTERIOR HIP CUSTOM (KITS) ×2 IMPLANT
PIN SECTOR W/GRIP ACE CUP 52MM (Hips) ×1 IMPLANT
SAW OSC TIP CART 19.5X105X1.3 (SAW) ×2 IMPLANT
SCREW 6.5MMX30MM (Screw) ×2 IMPLANT
SEALER BIPOLAR AQUA 6.0 (INSTRUMENTS) ×2 IMPLANT
SET HNDPC FAN SPRY TIP SCT (DISPOSABLE) ×1 IMPLANT
STEM TRI LOC BPS GRIPTON SZ 2 IMPLANT
SUT ETHIBOND NAB CT1 #1 30IN (SUTURE) ×4 IMPLANT
SUT MNCRL AB 3-0 PS2 18 (SUTURE) ×2 IMPLANT
SUT MNCRL AB 4-0 PS2 18 (SUTURE) ×2 IMPLANT
SUT MON AB 2-0 CT1 36 (SUTURE) ×4 IMPLANT
SUT STRATAFIX PDO 1 14 VIOLET (SUTURE) ×2
SUT STRATFX PDO 1 14 VIOLET (SUTURE) ×1
SUT VIC AB 2-0 CT1 27 (SUTURE) ×2
SUT VIC AB 2-0 CT1 TAPERPNT 27 (SUTURE) ×1 IMPLANT
SUTURE STRATFX PDO 1 14 VIOLET (SUTURE) ×1 IMPLANT
SYR 3ML LL SCALE MARK (SYRINGE) ×2 IMPLANT
TRAY FOLEY MTR SLVR 16FR STAT (SET/KITS/TRAYS/PACK) IMPLANT
TRI LOC BPS W GRIPTON SZ 2 ×2 IMPLANT
WATER STERILE IRR 1000ML POUR (IV SOLUTION) ×2 IMPLANT
YANKAUER SUCT BULB TIP 10FT TU (MISCELLANEOUS) ×2 IMPLANT

## 2018-09-29 NOTE — Transfer of Care (Signed)
Immediate Anesthesia Transfer of Care Note  Patient: Gazelle Towe  Procedure(s) Performed: TOTAL HIP ARTHROPLASTY ANTERIOR APPROACH (Right )  Patient Location: PACU  Anesthesia Type:Spinal  Level of Consciousness: awake, alert  and oriented  Airway & Oxygen Therapy: Patient Spontanous Breathing and Patient connected to face mask oxygen  Post-op Assessment: Report given to RN and Post -op Vital signs reviewed and stable  Post vital signs: Reviewed and stable  Last Vitals:  Vitals Value Taken Time  BP 101/61 09/29/18 1322  Temp    Pulse 71 09/29/18 1323  Resp 19 09/29/18 1323  SpO2 94 % 09/29/18 1323  Vitals shown include unvalidated device data.  Last Pain:  Vitals:   09/29/18 0840  TempSrc:   PainSc: 5       Patients Stated Pain Goal: 4 (21/11/55 2080)  Complications: No apparent anesthesia complications

## 2018-09-29 NOTE — Interval H&P Note (Signed)
History and Physical Interval Note:  09/29/2018 9:57 AM  Tara Guerra  has presented today for surgery, with the diagnosis of Degenerative joint disease right hip.  The various methods of treatment have been discussed with the patient and family. After consideration of risks, benefits and other options for treatment, the patient has consented to  Procedure(s): TOTAL HIP ARTHROPLASTY ANTERIOR APPROACH (Right) as a surgical intervention.  The patient's history has been reviewed, patient examined, no change in status, stable for surgery.  I have reviewed the patient's chart and labs.  Questions were answered to the patient's satisfaction.     Hilton Cork Jennilyn Esteve

## 2018-09-29 NOTE — Discharge Instructions (Signed)
°Dr. Helaman Mecca °Joint Replacement Specialist °[facility] °[address] °[place], [place] [address] °[phone] ° ° °TOTAL HIP REPLACEMENT POSTOPERATIVE DIRECTIONS ° ° ° °Hip Rehabilitation, Guidelines Following Surgery  ° °WEIGHT BEARING °Weight bearing as tolerated with assist device (Anna, cane, etc) as directed, use it as long as suggested by your surgeon or therapist, typically at least 4-6 weeks. ° °The results of a hip operation are greatly improved after range of motion and muscle strengthening exercises. Follow all safety measures which are given to protect your hip. If any of these exercises cause increased pain or swelling in your joint, decrease the amount until you are comfortable again. Then slowly increase the exercises. Call your caregiver if you have problems or questions.  ° °HOME CARE INSTRUCTIONS  °Most of the following instructions are designed to prevent the dislocation of your new hip.  °Remove items at home which could result in a fall. This includes throw rugs or furniture in walking pathways.  °Continue medications as instructed at time of discharge. °· You may have some home medications which will be placed on hold until you complete the course of blood thinner medication. °· You may start showering once you are discharged home. Do not remove your dressing. °Do not put on socks or shoes without following the instructions of your caregivers.   °Sit on chairs with arms. Use the chair arms to help push yourself up when arising.  °Arrange for the use of a toilet seat elevator so you are not sitting low.  °· Walk with Supple as instructed.  °You may resume a sexual relationship in one month or when given the OK by your caregiver.  °Use [place] as long as suggested by your caregivers.  °You may put full weight on your legs and walk as much as is comfortable. °Avoid periods of inactivity such as sitting longer than an hour when not asleep. This helps prevent  blood clots.  °You may return to work once you are cleared by your surgeon.  °Do not drive a car for 6 weeks or until released by your surgeon.  °Do not drive while taking narcotics.  °Wear elastic stockings for two weeks following surgery during the day but you may remove then at night.  °Make sure you keep all of your appointments after your operation with all of your doctors and caregivers. You should call the office at the above phone number and make an appointment for approximately two weeks after the date of your surgery. °Please pick up a stool softener and laxative for home use as long as you are requiring pain medications. °· ICE to the affected hip every three hours for 30 minutes at a time and then as needed for pain and swelling. Continue to use ice on the hip for pain and swelling from surgery. You may notice swelling that will progress down to the foot and ankle.  This is normal after surgery.  Elevate the leg when you are not up walking on it.   °It is important for you to complete the blood thinner medication as prescribed by your doctor. °· Continue to use the breathing machine which will help keep your temperature down.  It is common for your temperature to cycle up and down following surgery, especially at night when you are not up moving around and exerting yourself.  The breathing machine keeps your lungs expanded and your temperature down. ° °RANGE OF MOTION AND STRENGTHENING EXERCISES  °These exercises are   designed to help you keep full movement of your hip joint. Follow your caregiver's or physical therapist's instructions. Perform all exercises about fifteen times, three times per day or as directed. Exercise both hips, even if you have had only one joint replacement. These exercises can be done on a training (exercise) mat, on the floor, on a table or on a bed. Use whatever works the best and is most comfortable for you. Use music or television while you are exercising so that the exercises  are a pleasant break in your day. This will make your life better with the exercises acting as a break in routine you can look forward to.  °Lying on your back, slowly slide your foot toward your buttocks, raising your knee up off the floor. Then slowly slide your foot back down until your leg is straight again.  °Lying on your back spread your legs as far apart as you can without causing discomfort.  °Lying on your side, raise your upper leg and foot straight up from the floor as far as is comfortable. Slowly lower the leg and repeat.  °Lying on your back, tighten up the muscle in the front of your thigh (quadriceps muscles). You can do this by keeping your leg straight and trying to raise your heel off the floor. This helps strengthen the largest muscle supporting your knee.  °Lying on your back, tighten up the muscles of your buttocks both with the legs straight and with the knee bent at a comfortable angle while keeping your heel on the floor.  ° °SKILLED REHAB INSTRUCTIONS: °If the patient is transferred to a skilled rehab facility following release from the hospital, a list of the current medications will be sent to the facility for the patient to continue.  When discharged from the skilled rehab facility, please have the facility set up the patient's Home Health Physical Therapy prior to being released. Also, the skilled facility will be responsible for providing the patient with their medications at time of release from the facility to include their pain medication and their blood thinner medication. If the patient is still at the rehab facility at time of the two week follow up appointment, the skilled rehab facility will also need to assist the patient in arranging follow up appointment in our office and any transportation needs. ° °MAKE SURE YOU:  °Understand these instructions.  °Will watch your condition.  °Will get help right away if you are not doing well or get worse. ° °Pick up stool softner and  laxative for home use following surgery while on pain medications. °Do not remove your dressing. °The dressing is waterproof--it is OK to take showers. °Continue to use ice for pain and swelling after surgery. °Do not use any lotions or creams on the incision until instructed by your surgeon. °Total Hip Protocol. ° ° °

## 2018-09-29 NOTE — Op Note (Signed)
OPERATIVE REPORT  SURGEON: Rod Can, MD   ASSISTANT: Sherlean Foot, RNFA.  PREOPERATIVE DIAGNOSIS: Right hip arthritis.   POSTOPERATIVE DIAGNOSIS: Right hip arthritis.   PROCEDURE: Right total hip arthroplasty, anterior approach.   IMPLANTS: DePuy Tri Lock stem, size 2, hi offset. DePuy Pinnacle Cup, size 52 mm. DePuy Altrx liner, size 32 by 52 mm, neutral. DePuy Biolox ceramic head ball, size 32 + 5 mm. 6.5 mm cancellous bone screw x2.  ANESTHESIA:  Spinal  ESTIMATED BLOOD LOSS:-300 mL    ANTIBIOTICS: 2 g Ancef.  DRAINS: None.  COMPLICATIONS: None.   CONDITION: PACU - hemodynamically stable.   BRIEF CLINICAL NOTE: Chenise Mulvihill is a 73 y.o. female with a long-standing history of Right hip arthritis. After failing conservative management, the patient was indicated for total hip arthroplasty. The risks, benefits, and alternatives to the procedure were explained, and the patient elected to proceed.  PROCEDURE IN DETAIL: Surgical site was marked by myself in the pre-op holding area. Once inside the operating room, spinal anesthesia was obtained, and a foley catheter was inserted. The patient was then positioned on the Hana table.  All bony prominences were well padded.  The hip was prepped and draped in the normal sterile surgical fashion.  A time-out was called verifying side and site of surgery. The patient received IV antibiotics within 60 minutes of beginning the procedure.   The direct anterior approach to the hip was performed through the Hueter interval.  Lateral femoral circumflex vessels were treated with the Auqumantys. The anterior capsule was exposed and an inverted T capsulotomy was made. The femoral neck cut was made to the level of the templated cut.  A corkscrew was placed into the head and the head was removed.  The femoral head was found to have eburnated bone. The head was passed to the back table and was measured.   Acetabular exposure was  achieved, and the pulvinar and labrum were excised. Sequential reaming of the acetabulum was then performed up to a size 51 mm reamer. A 52 mm cup was then opened and impacted into place at approximately 40 degrees of abduction and 20 degrees of anteversion. I elected to augment the already acceptable press fit fixation with 6.5 mm cancellous screws x2. The final polyethylene liner was impacted into place and acetabular osteophytes were removed.    I then gained femoral exposure taking care to protect the abductors and greater trochanter.  This was performed using standard external rotation, extension, and adduction.  The capsule was peeled off the inner aspect of the greater trochanter, taking care to preserve the short external rotators. A cookie cutter was used to enter the femoral canal, and then the femoral canal finder was placed.  Sequential broaching was performed up to a size 2.  Calcar planer was used on the femoral neck remnant.  I placed a hi offset neck and a trial head ball.  The hip was reduced.  Leg lengths and offset were checked fluoroscopically. The +5 mm head ball was required to achieve adequate soft tissue tension.  The hip was dislocated and trial components were removed.  The final implants were placed, and the hip was reduced.  Fluoroscopy was used to confirm component position and leg lengths.  At 90 degrees of external rotation and full extension, the hip was stable to an anterior directed force.   The wound was copiously irrigated with Irrisept solution and normal saline using pule lavage.  Marcaine solution was injected into the  periarticular soft tissue.  The wound was closed in layers using #1 Stratafix for the fascia, 2-0 Vicryl for the subcutaneous fat, 2-0 Monocryl for the deep dermal layer, 3-0 running Monocryl subcuticular stitch, and Dermabond for the skin.  Once the glue was fully dried, an Aquacell Ag dressing was applied.  The patient was transported to the recovery room  in stable condition.  Sponge, needle, and instrument counts were correct at the end of the case x2.  The patient tolerated the procedure well and there were no known complications.

## 2018-09-29 NOTE — Anesthesia Procedure Notes (Signed)
Spinal  Patient location during procedure: OR End time: 09/29/2018 10:24 AM Staffing Resident/CRNA: Noralyn Pick D, CRNA Performed: anesthesiologist and resident/CRNA  Preanesthetic Checklist Completed: patient identified, site marked, surgical consent, pre-op evaluation, timeout performed, IV checked, risks and benefits discussed and monitors and equipment checked Spinal Block Patient position: sitting Prep: Betadine Patient monitoring: heart rate, continuous pulse ox and blood pressure Location: L2-3 Injection technique: single-shot Needle Needle type: Spinocan and Sprotte  Needle gauge: 24 G Needle length: 9 cm Assessment Sensory level: T6 Additional Notes Expiration date of kit checked and confirmed. Patient tolerated procedure well, without complications.

## 2018-09-29 NOTE — Evaluation (Signed)
Physical Therapy Evaluation Patient Details Name: Tara Guerra MRN: [id number] DOB: [birth year] Today's Date: 09/29/2018   History of Present Illness  73 yo female s/p R DA-THA on 09/29/18. PMH includes plantar fasciitis, HLD, HTN.  Clinical Impression  Pt presents with R hip pain, post-surgical R hip weakness, difficulty performing bed mobility, increased time and effort to perform mobility tasks, and decreased activity tolerance. Pt to benefit from acute PT to address deficits. Pt ambulated 30 ft with RW with min guard assist, verbal cuing for form and safety provided. Pt educated on ankle pumps (20/hour) to perform this afternoon/evening to increase circulation, to pt's tolerance and limited by pain. PT to progress mobility as tolerated, and will continue to follow acutely.        Follow Up Recommendations Follow surgeon's recommendation for DC plan and follow-up therapies;Supervision for mobility/OOB(HEP)    Equipment Recommendations  None recommended by PT    Recommendations for Other Services       Precautions / Restrictions Precautions Precautions: Fall Restrictions Weight Bearing Restrictions: No RLE Weight Bearing: Weight bearing as tolerated      Mobility  Bed Mobility Overal bed mobility: Needs Assistance Bed Mobility: Supine to Sit     Supine to sit: HOB elevated;Min assist     General bed mobility comments: Min assist for RLE lifting and translation to EOB. Verbal cuing for sequencing, increased time and effort.  Transfers Overall transfer level: Needs assistance Equipment used: Rolling Gwin (2 wheeled) Transfers: Sit to/from Stand Sit to Stand: Min guard;From elevated surface         General transfer comment: Min guard for safety. Verbal cuing for hand placement when rising.  Ambulation/Gait Ambulation/Gait assistance: Min guard Gait Distance (Feet): 30 Feet Assistive device: Rolling Mix (2 wheeled) Gait Pattern/deviations: Step-to  pattern;Decreased step length - right;Decreased step length - left;Trunk flexed;Antalgic Gait velocity: decr   General Gait Details: Min guard for safety. Verbal cuing for sequencing, placement in RW, uprigth posture.  Stairs            Wheelchair Mobility    Modified Rankin (Stroke Patients Only)       Balance Overall balance assessment: Mild deficits observed, not formally tested                                           Pertinent Vitals/Pain Pain Assessment: 0-10 Pain Score: 8  Pain Location: R hip, with ambulation Pain Descriptors / Indicators: Sore Pain Intervention(s): Limited activity within patient's tolerance;Monitored during session;Premedicated before session;Repositioned    Home Living Family/patient expects to be discharged to:: Private residence Living Arrangements: Children(daughter) Available Help at Discharge: Family;Available 24 hours/day Type of Home: House Home Access: Level entry     Home Layout: Two level;Bed/bath upstairs Home Equipment: Bedside commode;[place] - 2 wheels;Cane - single point      Prior Function Level of Independence: Independent with assistive device(s)         Comments: pt reports using cane for ambulation PTA     Hand Dominance   Dominant Hand: Right    Extremity/Trunk Assessment   Upper Extremity Assessment Upper Extremity Assessment: Overall WFL for tasks assessed    Lower Extremity Assessment Lower Extremity Assessment: Overall WFL for tasks assessed;RLE deficits/detail RLE Deficits / Details: suspected post-surgical hip weakness; able to perform ankle pumps, quad set, heel slides RLE Sensation: WNL    Cervical /  Trunk Assessment Cervical / Trunk Assessment: Normal  Communication   Communication: No difficulties  Cognition Arousal/Alertness: Awake/alert Behavior During Therapy: WFL for tasks assessed/performed Overall Cognitive Status: Within Functional Limits for tasks assessed                                         General Comments      Exercises     Assessment/Plan    PT Assessment Patient needs continued PT services  PT Problem List Decreased strength;Decreased mobility;Decreased activity tolerance;Decreased balance;Impaired sensation;Pain       PT Treatment Interventions Therapeutic activities;DME instruction;Gait training;Patient/family education;Balance training;Stair training;Therapeutic exercise;Functional mobility training    PT Goals (Current goals can be found in the Care Plan section)  Acute Rehab PT Goals Patient Stated Goal: go home PT Goal Formulation: With patient Time For Goal Achievement: 10/06/18 Potential to Achieve Goals: Good    Frequency 7X/week   Barriers to discharge        Co-evaluation               AM-PAC PT "6 Clicks" Mobility  Outcome Measure Help needed turning from your back to your side while in a flat bed without using bedrails?: A Little Help needed moving from lying on your back to sitting on the side of a flat bed without using bedrails?: A Little Help needed moving to and from a bed to a chair (including a wheelchair)?: A Little Help needed standing up from a chair using your arms (e.g., wheelchair or bedside chair)?: A Little Help needed to walk in hospital room?: A Little Help needed climbing 3-5 steps with a railing? : A Lot 6 Click Score: 17    End of Session Equipment Utilized During Treatment: Gait belt Activity Tolerance: Patient tolerated treatment well;Patient limited by pain Patient left: with chair alarm set;in chair;with call bell/phone within reach;with SCD's reapplied Nurse Communication: Mobility status PT Visit Diagnosis: Other abnormalities of gait and mobility (R26.89);Difficulty in walking, not elsewhere classified (R26.2)    Time: 9604-5409 PT Time Calculation (min) (ACUTE ONLY): 21 min   Charges:   PT Evaluation $PT Eval Low Complexity: 1 Low           Eleanor Dimichele Conception Chancy, PT Acute Rehabilitation Services Pager [phone]  Office [phone]  Donella Pascarella D Elonda Husky 09/29/2018, 6:05 PM

## 2018-09-30 DIAGNOSIS — I129 Hypertensive chronic kidney disease with stage 1 through stage 4 chronic kidney disease, or unspecified chronic kidney disease: Secondary | ICD-10-CM | POA: Diagnosis not present

## 2018-09-30 DIAGNOSIS — E7800 Pure hypercholesterolemia, unspecified: Secondary | ICD-10-CM | POA: Diagnosis not present

## 2018-09-30 DIAGNOSIS — N183 Chronic kidney disease, stage 3 (moderate): Secondary | ICD-10-CM | POA: Diagnosis not present

## 2018-09-30 DIAGNOSIS — M1611 Unilateral primary osteoarthritis, right hip: Secondary | ICD-10-CM | POA: Diagnosis not present

## 2018-09-30 DIAGNOSIS — Z1159 Encounter for screening for other viral diseases: Secondary | ICD-10-CM | POA: Diagnosis not present

## 2018-09-30 DIAGNOSIS — Z79899 Other long term (current) drug therapy: Secondary | ICD-10-CM | POA: Diagnosis not present

## 2018-09-30 LAB — BASIC METABOLIC PANEL
Anion gap: 9 (ref 5–15)
BUN: 20 mg/dL (ref 8–23)
CO2: 20 mmol/L — ABNORMAL LOW (ref 22–32)
Calcium: 8 mg/dL — ABNORMAL LOW (ref 8.9–10.3)
Chloride: 107 mmol/L (ref 98–111)
Creatinine, Ser: 1.49 mg/dL — ABNORMAL HIGH (ref 0.44–1.00)
GFR calc Af Amer: 40 mL/min — ABNORMAL LOW (ref >60)
GFR calc non Af Amer: 34 mL/min — ABNORMAL LOW (ref >60)
Glucose, Bld: 148 mg/dL — ABNORMAL HIGH (ref 70–99)
Potassium: 4 mmol/L (ref 3.5–5.1)
Sodium: 136 mmol/L (ref 135–145)

## 2018-09-30 LAB — CBC
HCT: 33.6 % — ABNORMAL LOW (ref 36.0–46.0)
Hemoglobin: 10.3 g/dL — ABNORMAL LOW (ref 12.0–15.0)
MCH: 27.4 pg (ref 26.0–34.0)
MCHC: 30.7 g/dL (ref 30.0–36.0)
MCV: 89.4 fL (ref 80.0–100.0)
Platelets: 206 10*3/uL (ref 150–400)
RBC: 3.76 MIL/uL — ABNORMAL LOW (ref 3.87–5.11)
RDW: 15.3 % (ref 11.5–15.5)
WBC: 8.2 10*3/uL (ref 4.0–10.5)
nRBC: 0 % (ref 0.0–0.2)

## 2018-09-30 MED ORDER — ASPIRIN 81 MG PO CHEW: 81.0000 mg | CHEWABLE_TABLET | Freq: Two times a day (BID) | ORAL | 1 refills | Status: DC

## 2018-09-30 MED ORDER — ONDANSETRON HCL 4 MG PO TABS: 4.0000 mg | ORAL_TABLET | Freq: Four times a day (QID) | ORAL | 0 refills | Status: DC | PRN

## 2018-09-30 MED ORDER — HYDROCODONE-ACETAMINOPHEN 5-325 MG PO TABS: 1.0000 | ORAL_TABLET | ORAL | 0 refills | Status: DC | PRN

## 2018-09-30 MED ORDER — SENNA 8.6 MG PO TABS: 1.0000 | ORAL_TABLET | Freq: Two times a day (BID) | ORAL | 0 refills | Status: DC

## 2018-09-30 MED ORDER — DOCUSATE SODIUM 100 MG PO CAPS: 100.0000 mg | ORAL_CAPSULE | Freq: Two times a day (BID) | ORAL | 1 refills | Status: DC

## 2018-09-30 NOTE — Progress Notes (Signed)
Physical Therapy Treatment Patient Details Name: Tara Guerra MRN: [id number] DOB: 07-Mar-1946 Today's Date: 09/30/2018    History of Present Illness Pt is a 73 y/o female s/p R THA-direct anterior on 09/29/18. PMH includes plantar fasciitis, HLD, HTN.    PT Comments    Pt ambulated in hallway again this afternoon and improved distance.  Pt declined performing standing exercises however demonstrated for pt, and pt aware of safety using countertop and another person standby (for first time).  Pt reviewed other exercises this morning with another PT and had HEP handout present in room.  Pt had no further questions and ready for d/c home today.   Follow Up Recommendations  Supervision for mobility/OOB;Follow surgeon's recommendation for DC plan and follow-up therapies     Equipment Recommendations  None recommended by PT    Recommendations for Other Services       Precautions / Restrictions Precautions Precautions: Fall Restrictions RLE Weight Bearing: Weight bearing as tolerated    Mobility  Bed Mobility Overal bed mobility: Needs Assistance Bed Mobility: Supine to Sit     Supine to sit: Min guard;HOB elevated     General bed mobility comments: pt up in recliner on arrival  Transfers Overall transfer level: Needs assistance Equipment used: Rolling Spangler (2 wheeled) Transfers: Sit to/from Stand Sit to Stand: Min guard         General transfer comment: verbal cues for UE and LE positioning, increased time and effort  Ambulation/Gait Ambulation/Gait assistance: Min guard Gait Distance (Feet): 140 Feet Assistive device: Rolling Fredricksen (2 wheeled) Gait Pattern/deviations: Step-to pattern;Decreased stance time - right;Antalgic Gait velocity: decr   General Gait Details: verbal cues for sequence, RW positioning, posture   Stairs             Wheelchair Mobility    Modified Rankin (Stroke Patients Only)       Balance                                             Cognition Arousal/Alertness: Awake/alert Behavior During Therapy: WFL for tasks assessed/performed Overall Cognitive Status: Within Functional Limits for tasks assessed                                        Exercises      General Comments        Pertinent Vitals/Pain Pain Assessment: 0-10 Pain Score: 4  Pain Location: R hip, with ambulation Pain Descriptors / Indicators: Sore;Aching Pain Intervention(s): Monitored during session;Premedicated before session;Repositioned    Home Living                      Prior Function            PT Goals (current goals can now be found in the care plan section) Progress towards PT goals: Progressing toward goals    Frequency    7X/week      PT Plan Current plan remains appropriate    Co-evaluation              AM-PAC PT "6 Clicks" Mobility   Outcome Measure  Help needed turning from your back to your side while in a flat bed without using bedrails?: A Little Help needed moving from lying on your back  to sitting on the side of a flat bed without using bedrails?: A Little Help needed moving to and from a bed to a chair (including a wheelchair)?: A Little Help needed standing up from a chair using your arms (e.g., wheelchair or bedside chair)?: A Little Help needed to walk in hospital room?: A Little Help needed climbing 3-5 steps with a railing? : A Little 6 Click Score: 18    End of Session Equipment Utilized During Treatment: Gait belt Activity Tolerance: Patient tolerated treatment well Patient left: in chair;with call bell/phone within reach Nurse Communication: Mobility status PT Visit Diagnosis: Other abnormalities of gait and mobility (R26.89);Difficulty in walking, not elsewhere classified (R26.2)     Time: 8841-6606 PT Time Calculation (min) (ACUTE ONLY): 11 min  Charges:  $Gait Training: 8-22 mins                     Carmelia Bake, PT,  DPT Acute Rehabilitation Services Office: [phone] Pager: [phone]  Trena Platt 09/30/2018, 3:09 PM

## 2018-09-30 NOTE — Anesthesia Postprocedure Evaluation (Signed)
Anesthesia Post Note  Patient: Tara Guerra  Procedure(s) Performed: TOTAL HIP ARTHROPLASTY ANTERIOR APPROACH (Right )     Patient location during evaluation: PACU Anesthesia Type: Spinal Level of consciousness: oriented and awake and alert Pain management: pain level controlled Vital Signs Assessment: post-procedure vital signs reviewed and stable Respiratory status: spontaneous breathing, respiratory function stable and patient connected to nasal cannula oxygen Cardiovascular status: blood pressure returned to baseline and stable Postop Assessment: no headache, no backache and no apparent nausea or vomiting Anesthetic complications: no    Last Vitals:  Vitals:   09/30/18 0045 09/30/18 0459  BP: 124/78 131/65  Pulse: 78 68  Resp: 16 16  Temp: (!) 36.4 C 36.7 C  SpO2: 98% 99%    Last Pain:  Vitals:   09/30/18 0545  TempSrc:   PainSc: [name]

## 2018-09-30 NOTE — Progress Notes (Signed)
    Subjective:  Patient reports pain as mild to moderate.  Denies N/V/CP/SOB. No c/o.  Objective:   VITALS:   Vitals:   09/29/18 2127 09/30/18 0045 09/30/18 0459 09/30/18 1340  BP: 126/71 124/78 131/65 130/65  Pulse: 88 78 68 72  Resp: 16 16 16 16   Temp: 98 F (36.7 C) (!) 97.5 F (36.4 C) 98 F (36.7 C) 98 F (36.7 C)  TempSrc:    Oral  SpO2: 98% 98% 99% 97%  Weight:      Height:        NAD ABD soft Sensation intact distally Intact pulses distally Dorsiflexion/Plantar flexion intact Incision: dressing C/D/I Compartment soft   Lab Results  Component Value Date   WBC 8.2 09/30/2018   HGB 10.3 (L) 09/30/2018   HCT 33.6 (L) 09/30/2018   MCV 89.4 09/30/2018   PLT 206 09/30/2018   BMET    Component Value Date/Time   NA 136 09/30/2018 0259   NA 141 09/19/2018 1516   K 4.0 09/30/2018 0259   CL 107 09/30/2018 0259   CO2 20 (L) 09/30/2018 0259   GLUCOSE 148 (H) 09/30/2018 0259   BUN 20 09/30/2018 0259   BUN 18 09/19/2018 1516   CREATININE 1.49 (H) 09/30/2018 0259   CALCIUM 8.0 (L) 09/30/2018 0259   GFRNONAA 34 (L) 09/30/2018 0259   GFRAA 40 (L) 09/30/2018 0259     Assessment/Plan: 1 Day Post-Op   Principal Problem:   Osteoarthritis of right hip   WBAT with Fortune DVT ppx: Aspirin, SCDs, TEDS PO pain control PT/OT CKD III: mild elevation in Cr, recheck in am Dispo: likely d/c home tomorrow am with HEP   Hilton Cork Sybilla Malhotra 09/30/2018, 3:08 PM   Rod Can, MD Cell: [phone] [facility] is now [facility]  [place] [address], [address], [place], [place] [address] Phone: [phone] [email or web address] Facebook  [employer]

## 2018-09-30 NOTE — Progress Notes (Signed)
Physical Therapy Treatment Patient Details Name: Tara Guerra MRN: [id number] DOB: [birth year] Today's Date: 09/30/2018    History of Present Illness Pt is a 73 y/o female s/p R THA-direct anterior on 09/29/18. PMH includes plantar fasciitis, HLD, HTN.    PT Comments    Pt premedicated for session.  Pt ambulated short distance in hallway limited by fatigue and pain.     Follow Up Recommendations  Supervision for mobility/OOB;Follow surgeon's recommendation for DC plan and follow-up therapies     Equipment Recommendations  None recommended by PT    Recommendations for Other Services       Precautions / Restrictions Precautions Precautions: Fall Restrictions Weight Bearing Restrictions: Yes RLE Weight Bearing: Weight bearing as tolerated    Mobility  Bed Mobility Overal bed mobility: Needs Assistance Bed Mobility: Supine to Sit     Supine to sit: Min guard;HOB elevated     General bed mobility comments: increased time and effort; pt self assist R LE with UEs  Transfers Overall transfer level: Needs assistance Equipment used: Rolling Odeh (2 wheeled) Transfers: Sit to/from Stand Sit to Stand: Min guard;From elevated surface         General transfer comment: verbal cues for UE and LE positioning, increased time and effort  Ambulation/Gait Ambulation/Gait assistance: Min guard Gait Distance (Feet): 40 Feet Assistive device: Rolling Etzkorn (2 wheeled) Gait Pattern/deviations: Step-to pattern;Decreased stance time - right;Antalgic Gait velocity: decr   General Gait Details: verbal cues for sequence, RW positioning, posture   Stairs             Wheelchair Mobility    Modified Rankin (Stroke Patients Only)       Balance                                            Cognition Arousal/Alertness: Awake/alert Behavior During Therapy: WFL for tasks assessed/performed Overall Cognitive Status: Within Functional Limits for tasks  assessed                                        Exercises    General Comments        Pertinent Vitals/Pain Pain Assessment: 0-10 Pain Score: 6  Pain Location: R hip, with ambulation Pain Descriptors / Indicators: Sore;Aching Pain Intervention(s): Monitored during session;Premedicated before session;Repositioned    Home Living                      Prior Function            PT Goals (current goals can now be found in the care plan section) Acute Rehab PT Goals PT Goal Formulation: With patient Time For Goal Achievement: 10/06/18 Potential to Achieve Goals: Good Progress towards PT goals: Progressing toward goals    Frequency    7X/week      PT Plan Current plan remains appropriate    Co-evaluation              AM-PAC PT "6 Clicks" Mobility   Outcome Measure  Help needed turning from your back to your side while in a flat bed without using bedrails?: A Little Help needed moving from lying on your back to sitting on the side of a flat bed without using bedrails?: A Little Help needed moving  to and from a bed to a chair (including a wheelchair)?: A Little Help needed standing up from a chair using your arms (e.g., wheelchair or bedside chair)?: A Little Help needed to walk in hospital room?: A Little Help needed climbing 3-5 steps with a railing? : A Lot 6 Click Score: 17    End of Session Equipment Utilized During Treatment: Gait belt Activity Tolerance: Patient tolerated treatment well Patient left: in chair;with chair alarm set;with call bell/phone within reach Nurse Communication: Mobility status PT Visit Diagnosis: Other abnormalities of gait and mobility (R26.89);Difficulty in walking, not elsewhere classified (R26.2)     Time: [id number] PT Time Calculation (min) (ACUTE ONLY): 9 min  Charges:  $Gait Training: 8-22 mins                    Zenovia JarredKati Bay Jarquin, PT, DPT Acute Rehabilitation Services Office: [phone] Pager:  [phone]  Sarajane JewsLEMYRE,KATHrine E 09/30/2018, 1:05 PM

## 2018-09-30 NOTE — Progress Notes (Signed)
Physical Therapy Progress Note  Clinical Impression: Pt seen for LE strengthening therex. PT provided HEP handout and reviewed in full with pt. PT provided pt education re: ice, generalized walking program and car transfers with demonstration. Pt would continue to benefit from skilled physical therapy services at this time while admitted and after d/c to address the below listed limitations in order to improve overall safety and independence with functional mobility.  Sherie Don, [place], DPT  Acute Rehabilitation Services Pager [phone] Office [phone]    09/30/18 1000  PT Visit Information  Last PT Received On 09/30/18  Assistance Needed +1  History of Present Illness Pt is a 73 y/o female s/p R THA-direct anterior on 09/29/18. PMH includes plantar fasciitis, HLD, HTN.  Precautions  Precautions Fall  Restrictions  Weight Bearing Restrictions Yes  RLE Weight Bearing WBAT  Pain Assessment  Pain Assessment No/denies pain  Cognition  Arousal/Alertness Awake/alert  Behavior During Therapy WFL for tasks assessed/performed  Overall Cognitive Status Within Functional Limits for tasks assessed  Exercises  Exercises Total Joint  Total Joint Exercises  Ankle Circles/Pumps AROM;Both;10 reps;Supine  Quad Sets AROM;Strengthening;Right;10 reps;Supine  Short Arc Quad AROM;Strengthening;Right;10 reps;Supine  Heel Slides AROM;Strengthening;Right;10 reps;Supine  Hip ABduction/ADduction AROM;Strengthening;Right;10 reps;Supine  Straight Leg Raises AROM;Strengthening;Right;10 reps;Supine  PT - End of Session  Activity Tolerance Patient tolerated treatment well  Patient left in bed;with call bell/phone within reach;with SCD's reapplied  Nurse Communication Mobility status   PT - Assessment/Plan  PT Plan Current plan remains appropriate  PT Visit Diagnosis Other abnormalities of gait and mobility (R26.89);Difficulty in walking, not elsewhere classified (R26.2)  PT Frequency (ACUTE ONLY)  7X/week  Follow Up Recommendations Supervision for mobility/OOB;Other (comment) (home with HEP)  PT equipment None recommended by PT  AM-PAC PT "6 Clicks" Mobility Outcome Measure (Version 2)  Help needed turning from your back to your side while in a flat bed without using bedrails? 3  Help needed moving from lying on your back to sitting on the side of a flat bed without using bedrails? 3  Help needed moving to and from a bed to a chair (including a wheelchair)? 3  Help needed standing up from a chair using your arms (e.g., wheelchair or bedside chair)? 3  Help needed to walk in hospital room? 3  Help needed climbing 3-5 steps with a railing?  2  6 Click Score 17  Consider Recommendation of Discharge To: Home with [facility]  PT Goal Progression  Progress towards PT goals Progressing toward goals  Acute Rehab PT Goals  PT Goal Formulation With patient  Time For Goal Achievement 10/06/18  Potential to Achieve Goals Good  PT Time Calculation  PT Start Time (ACUTE ONLY) 2229  PT Stop Time (ACUTE ONLY) 0835  PT Time Calculation (min) (ACUTE ONLY) 12 min  PT General Charges  $$ ACUTE PT VISIT 1 Visit  PT Treatments  $Therapeutic Exercise 8-22 mins

## 2018-09-30 NOTE — Progress Notes (Signed)
Discharge Plan of Care:  HEP Patient has DME

## 2018-09-30 NOTE — Discharge Summary (Signed)
Physician Discharge Summary  Patient ID: Tara Guerra MRN: [id number] DOB/AGE: 73-May-1947 73 y.o.  Admit date: 09/29/2018 Discharge date: 10/01/2018  Admission Diagnoses:  Osteoarthritis of right hip  Discharge Diagnoses:  Principal Problem:   Osteoarthritis of right hip   Past Medical History:  Diagnosis Date  . Arthritis    hips andhands , Knees  . Chronic kidney disease   . Hypercholesteremia   . Hypertension     Surgeries: Procedure(s): TOTAL HIP ARTHROPLASTY ANTERIOR APPROACH on 09/29/2018   Consultants (if any):   Discharged Condition: Improved  Hospital Course: Tara Guerra is an 73 y.o. female who was admitted 09/29/2018 with a diagnosis of Osteoarthritis of right hip and went to the operating room on 09/29/2018 and underwent the above named procedures.    She was given perioperative antibiotics:  Anti-infectives (From admission, onward)   Start     Dose/Rate Route Frequency Ordered Stop   09/29/18 1600  ceFAZolin (ANCEF) IVPB 2g/100 mL premix     2 g 200 mL/hr over 30 Minutes Intravenous Every 6 hours 09/29/18 1534 09/29/18 2209   09/29/18 0830  ceFAZolin (ANCEF) IVPB 2g/100 mL premix     2 g 200 mL/hr over 30 Minutes Intravenous On call to O.R. 09/29/18 0820 09/29/18 1026    .  She was given sequential compression devices, early ambulation, and ASA for DVT prophylaxis.  She benefited maximally from the hospital stay and there were no complications.    Recent vital signs:  Vitals:   10/01/18 0506 10/01/18 1042  BP: 138/72 122/60  Pulse: 66 75  Resp: 20 15  Temp: 97.9 F (36.6 C) 98.1 F (36.7 C)  SpO2: 96% 96%    Recent laboratory studies:  Lab Results  Component Value Date   HGB 9.4 (L) 10/01/2018   HGB 10.3 (L) 09/30/2018   HGB 13.2 09/26/2018   Lab Results  Component Value Date   WBC 7.2 10/01/2018   PLT 215 10/01/2018   Lab Results  Component Value Date   INR 1.1 09/26/2018   Lab Results  Component Value Date   NA 137  10/01/2018   K 4.3 10/01/2018   CL 107 10/01/2018   CO2 22 10/01/2018   BUN 29 (H) 10/01/2018   CREATININE 1.46 (H) 10/01/2018   GLUCOSE 121 (H) 10/01/2018    Discharge Medications:   Allergies as of 10/01/2018   No Known Allergies     Medication List    STOP taking these medications   traMADol 50 MG tablet Commonly known as: ULTRAM     TAKE these medications   aspirin 81 MG chewable tablet Chew 1 tablet (81 mg total) by mouth 2 (two) times daily.   BENGAY EX Apply 1 application topically daily as needed (pain).   docusate sodium 100 MG capsule Commonly known as: COLACE Take 1 capsule (100 mg total) by mouth 2 (two) times daily.   HYDROcodone-acetaminophen 5-325 MG tablet Commonly known as: NORCO/VICODIN Take 1-2 tablets by mouth every 4 (four) hours as needed for moderate pain (pain score 4-6).   olmesartan-hydrochlorothiazide 40-25 MG tablet Commonly known as: BENICAR HCT Take 1 tablet by mouth daily.   omeprazole 10 MG capsule Commonly known as: PRILOSEC TAKE 1 CAPSULE(10 MG) BY MOUTH DAILY What changed: See the new instructions.   ondansetron 4 MG tablet Commonly known as: ZOFRAN Take 1 tablet (4 mg total) by mouth every 6 (six) hours as needed for nausea.   rosuvastatin 10 MG tablet Commonly known as:  CRESTOR Take 1 tablet (10 mg total) by mouth at bedtime.   senna 8.6 MG Tabs tablet Commonly known as: SENOKOT Take 1 tablet (8.6 mg total) by mouth 2 (two) times daily.   TURMERIC PO Take 1,000 mg by mouth daily.   Vitamin D (Ergocalciferol) 1.25 MG (50000 UT) Caps capsule Commonly known as: DRISDOL Take 5,000 Units by mouth once a week.       Diagnostic Studies: Dg Pelvis Portable  Result Date: 09/29/2018 CLINICAL DATA:  Post-op Pacu film. Post right hip replacement. EXAM: PORTABLE PELVIS 1-2 VIEWS COMPARISON:  09/29/2018, 04/21/2017 FINDINGS: Status post RIGHT total hip arthroplasty. The femoral head component is well seated in the acetabular  component on this portable supine projection. No interval fracture. Mild degenerative changes are seen in the SI joints and LOWER spine. Bilateral tubal ligation. Surgical clips overlie the LEFT inguinal region. IMPRESSION: Status post RIGHT total hip arthroplasty. No adverse features identified. Electronically Signed   By: Nolon Nations M.D.   On: 09/29/2018 14:30   Dg C-arm 1-60 Min-no Report  Result Date: 09/29/2018 Fluoroscopy was utilized by the requesting physician.  No radiographic interpretation.   Dg Hip Port Unilat With Pelvis 1v Right  Result Date: 09/29/2018 CLINICAL DATA:  c-arm fluoro for rt anterior hip replacement, 21 sec fluoro time EXAM: DG HIP (WITH OR WITHOUT PELVIS) 1V PORT RIGHT COMPARISON:  04/21/2017 FINDINGS: Images are performed prior to and following RIGHT total hip arthroplasty. Images demonstrate well seated total hip arthroplasty. IMPRESSION: RIGHT total hip arthroplasty. Electronically Signed   By: Nolon Nations M.D.   On: 09/29/2018 14:27    Disposition:    Discharge Instructions    Call MD / Call 911   Complete by: As directed    If you experience chest pain or shortness of breath, CALL 911 and be transported to the hospital emergency room.  If you develope a fever above 101 F, pus (white drainage) or increased drainage or redness at the wound, or calf pain, call your surgeon's office.   Call MD / Call 911   Complete by: As directed    If you experience chest pain or shortness of breath, CALL 911 and be transported to the hospital emergency room.  If you develope a fever above 101 F, pus (white drainage) or increased drainage or redness at the wound, or calf pain, call your surgeon's office.   Constipation Prevention   Complete by: As directed    Drink plenty of fluids.  Prune juice may be helpful.  You may use a stool softener, such as Colace (over the counter) 100 mg twice a day.  Use MiraLax (over the counter) for constipation as needed.    Constipation Prevention   Complete by: As directed    Drink plenty of fluids.  Prune juice may be helpful.  You may use a stool softener, such as Colace (over the counter) 100 mg twice a day.  Use MiraLax (over the counter) for constipation as needed.   Diet - low sodium heart healthy   Complete by: As directed    Diet - low sodium heart healthy   Complete by: As directed    Increase activity slowly as tolerated   Complete by: As directed    Increase activity slowly as tolerated   Complete by: As directed    TED hose   Complete by: As directed    Use stockings (TED hose) for 2 weeks on both leg(s).  You may remove them at  night for sleeping.      Follow-up Information    Chelby Salata, Arlys JohnBrian, MD. Schedule an appointment as soon as possible for a visit in 2 weeks.   Specialty: Orthopedic Surgery Why: For wound re-check Contact information: [address] [place] [address] [phone]            Signed: Iline OvenBrian J Joella Saefong 10/03/2018, 8:11 AM

## 2018-10-01 DIAGNOSIS — Z1159 Encounter for screening for other viral diseases: Secondary | ICD-10-CM | POA: Diagnosis not present

## 2018-10-01 DIAGNOSIS — Z79899 Other long term (current) drug therapy: Secondary | ICD-10-CM | POA: Diagnosis not present

## 2018-10-01 DIAGNOSIS — M1611 Unilateral primary osteoarthritis, right hip: Secondary | ICD-10-CM | POA: Diagnosis not present

## 2018-10-01 DIAGNOSIS — E7800 Pure hypercholesterolemia, unspecified: Secondary | ICD-10-CM | POA: Diagnosis not present

## 2018-10-01 DIAGNOSIS — N183 Chronic kidney disease, stage 3 (moderate): Secondary | ICD-10-CM | POA: Diagnosis not present

## 2018-10-01 DIAGNOSIS — I129 Hypertensive chronic kidney disease with stage 1 through stage 4 chronic kidney disease, or unspecified chronic kidney disease: Secondary | ICD-10-CM | POA: Diagnosis not present

## 2018-10-01 LAB — BASIC METABOLIC PANEL
Anion gap: 8 (ref 5–15)
BUN: 29 mg/dL — ABNORMAL HIGH (ref 8–23)
CO2: 22 mmol/L (ref 22–32)
Calcium: 8.3 mg/dL — ABNORMAL LOW (ref 8.9–10.3)
Chloride: 107 mmol/L (ref 98–111)
Creatinine, Ser: 1.46 mg/dL — ABNORMAL HIGH (ref 0.44–1.00)
GFR calc Af Amer: 41 mL/min — ABNORMAL LOW (ref >60)
GFR calc non Af Amer: 35 mL/min — ABNORMAL LOW (ref >60)
Glucose, Bld: 121 mg/dL — ABNORMAL HIGH (ref 70–99)
Potassium: 4.3 mmol/L (ref 3.5–5.1)
Sodium: 137 mmol/L (ref 135–145)

## 2018-10-01 LAB — CBC
HCT: 30.3 % — ABNORMAL LOW (ref 36.0–46.0)
Hemoglobin: 9.4 g/dL — ABNORMAL LOW (ref 12.0–15.0)
MCH: 27.3 pg (ref 26.0–34.0)
MCHC: 31 g/dL (ref 30.0–36.0)
MCV: 88.1 fL (ref 80.0–100.0)
Platelets: 215 10*3/uL (ref 150–400)
RBC: 3.44 MIL/uL — ABNORMAL LOW (ref 3.87–5.11)
RDW: 15.7 % — ABNORMAL HIGH (ref 11.5–15.5)
WBC: 7.2 10*3/uL (ref 4.0–10.5)
nRBC: 0 % (ref 0.0–0.2)

## 2018-10-01 NOTE — Progress Notes (Signed)
   Subjective: 2 Days Post-Op Procedure(s) (LRB): TOTAL HIP ARTHROPLASTY ANTERIOR APPROACH (Right)  Pt c/o mild pain in the right hip Otherwise doing well Ready for d/c later today after therapy Denies any new symptoms or issues Patient reports pain as mild.  Objective:   VITALS:   Vitals:   09/30/18 2052 10/01/18 0506  BP: 138/73 138/72  Pulse: 70 66  Resp: 20 20  Temp: 98.3 F (36.8 C) 97.9 F (36.6 C)  SpO2: 95% 96%    Right hip incision healing well nv intact distally No rashes or edema distally Good early rom  LABS Recent Labs    09/30/18 0259 10/01/18 0419  HGB 10.3* 9.4*  HCT 33.6* 30.3*  WBC 8.2 7.2  PLT 206 215    Recent Labs    09/30/18 0259 10/01/18 0419  NA 136 137  K 4.0 4.3  BUN 20 29*  CREATININE 1.49* 1.46*  GLUCOSE 148* 121*     Assessment/Plan: 2 Days Post-Op Procedure(s) (LRB): TOTAL HIP ARTHROPLASTY ANTERIOR APPROACH (Right) PT/OT Plan for d/c later today F/u in the office in 2 weeks Pain management as needed    Kathrynn Speed, [place] [facility] now [employer] [address], [place], [place], [place] [address] Phone: [phone] [email or web address] Facebook  [employer]

## 2018-10-01 NOTE — Progress Notes (Signed)
Physical Therapy Treatment Patient Details Name: Tara Guerra MRN: [id number] DOB: [birth year] Today's Date: 10/01/2018    History of Present Illness Pt is a 73 y/o female s/p R THA-direct anterior on 09/29/18. PMH includes plantar fasciitis, HLD, HTN.    PT Comments    Pt progressing well. Educated on car transfer, bed mobility and exercises reviewed. Also on how to progress with RW to cane to no AD. Pt feels confident about mobility and Dc today. Not further PT needed at this time.    Follow Up Recommendations  Supervision for mobility/OOB;Follow surgeon's recommendation for DC plan and follow-up therapies     Equipment Recommendations  None recommended by PT    Recommendations for Other Services       Precautions / Restrictions Precautions Precautions: Fall Restrictions RLE Weight Bearing: Weight bearing as tolerated    Mobility  Bed Mobility                  Transfers Overall transfer level: Needs assistance Equipment used: Rolling Harris (2 wheeled) Transfers: Sit to/from Stand Sit to Stand: Min guard;From elevated surface         General transfer comment: verbal cues for UE and LE positioning, increased time and effort  Ambulation/Gait Ambulation/Gait assistance: Min guard Gait Distance (Feet): 120 Feet Assistive device: Rolling Baisley (2 wheeled) Gait Pattern/deviations: Step-through pattern;Antalgic Gait velocity: decr   General Gait Details: antalgic in the beginning but improved with increased distance   Stairs             Wheelchair Mobility    Modified Rankin (Stroke Patients Only)       Balance                                            Cognition Arousal/Alertness: Awake/alert Behavior During Therapy: WFL for tasks assessed/performed Overall Cognitive Status: Within Functional Limits for tasks assessed                                        Exercises      General Comments         Pertinent Vitals/Pain Pain Score: 3  Pain Location: R hip, with ambulation anterior quad area Pain Descriptors / Indicators: Sore;Aching Pain Intervention(s): Monitored during session;Premedicated before session;Ice applied    Home Living                      Prior Function            PT Goals (current goals can now be found in the care plan section) Progress towards PT goals: Progressing toward goals    Frequency    7X/week      PT Plan Current plan remains appropriate    Co-evaluation              AM-PAC PT "6 Clicks" Mobility   Outcome Measure  Help needed turning from your back to your side while in a flat bed without using bedrails?: None Help needed moving from lying on your back to sitting on the side of a flat bed without using bedrails?: None Help needed moving to and from a bed to a chair (including a wheelchair)?: None Help needed standing up from a chair using your arms (e.g., wheelchair or  bedside chair)?: None Help needed to walk in hospital room?: A Little Help needed climbing 3-5 steps with a railing? : A Little 6 Click Score: 22    End of Session Equipment Utilized During Treatment: Gait belt Activity Tolerance: Patient tolerated treatment well Patient left: in chair Nurse Communication: Mobility status PT Visit Diagnosis: Other abnormalities of gait and mobility (R26.89);Difficulty in walking, not elsewhere classified (R26.2)     Time: 1010-1038 PT Time Calculation (min) (ACUTE ONLY): 28 min  Charges:  $Gait Training: 8-22 mins $Therapeutic Exercise: 8-22 mins                     [name], PT Acute Rehabilitation Services Pager: [phone] Office: [phone] 10/01/2018    [name], [name] 10/01/2018, 11:23 AM

## 2018-10-01 NOTE — Plan of Care (Signed)
Talked with patient regarding status post THA.  She is doing well and is confident she will be able to function well at home.

## 2018-10-01 NOTE — Discharge Summary (Cosign Needed)
Orthopedic Discharge Summary        Physician Discharge Summary  Patient ID: Tara Guerra MRN: [id number] DOB/AGE: [birth year] 73 y.o.  Admit date: 09/29/2018 Discharge date: 10/01/2018   Procedures:  Procedure(s) (LRB): TOTAL HIP ARTHROPLASTY ANTERIOR APPROACH (Right)  Attending Physician:  Dr. Rod Can  Admission Diagnoses:   Right hip end stage osteoarthritis  Discharge Diagnoses:  Right hip end stage osteoarthritis   Past Medical History:  Diagnosis Date  . Arthritis    hips andhands , Knees  . Chronic kidney disease   . Hypercholesteremia   . Hypertension     PCP: Glendale Chard, MD   Discharged Condition: good  Hospital Course:  Patient underwent the above stated procedure on 09/29/2018. Patient tolerated the procedure well and brought to the recovery room in good condition and subsequently to the floor. Patient had an uncomplicated hospital course and was stable for discharge.   Disposition: Discharge disposition: 01-Home or Self Care      with follow up in 2 weeks   Follow-up Information    Swinteck, Aaron Edelman, MD. Schedule an appointment as soon as possible for a visit in 2 weeks.   Specialty: Orthopedic Surgery Why: For wound re-check Contact information: [address] [place] [address] [phone]           Discharge Instructions    Call MD / Call 911   Complete by: As directed    If you experience chest pain or shortness of breath, CALL 911 and be transported to the hospital emergency room.  If you develope a fever above 101 F, pus (white drainage) or increased drainage or redness at the wound, or calf pain, call your surgeon's office.   Call MD / Call 911   Complete by: As directed    If you experience chest pain or shortness of breath, CALL 911 and be transported to the hospital emergency room.  If you develope a fever above 101 F, pus (white drainage) or increased drainage or redness at the wound, or calf  pain, call your surgeon's office.   Constipation Prevention   Complete by: As directed    Drink plenty of fluids.  Prune juice may be helpful.  You may use a stool softener, such as Colace (over the counter) 100 mg twice a day.  Use MiraLax (over the counter) for constipation as needed.   Constipation Prevention   Complete by: As directed    Drink plenty of fluids.  Prune juice may be helpful.  You may use a stool softener, such as Colace (over the counter) 100 mg twice a day.  Use MiraLax (over the counter) for constipation as needed.   Diet - low sodium heart healthy   Complete by: As directed    Diet - low sodium heart healthy   Complete by: As directed    Increase activity slowly as tolerated   Complete by: As directed    Increase activity slowly as tolerated   Complete by: As directed    TED hose   Complete by: As directed    Use stockings (TED hose) for 2 weeks on both leg(s).  You may remove them at night for sleeping.      Allergies as of 10/01/2018   No Known Allergies     Medication List    STOP taking these medications   traMADol 50 MG tablet Commonly known as: ULTRAM     TAKE these medications   aspirin 81 MG chewable tablet  Chew 1 tablet (81 mg total) by mouth 2 (two) times daily.   BENGAY EX Apply 1 application topically daily as needed (pain).   docusate sodium 100 MG capsule Commonly known as: COLACE Take 1 capsule (100 mg total) by mouth 2 (two) times daily.   HYDROcodone-acetaminophen 5-325 MG tablet Commonly known as: NORCO/VICODIN Take 1-2 tablets by mouth every 4 (four) hours as needed for moderate pain (pain score 4-6).   olmesartan-hydrochlorothiazide 40-25 MG tablet Commonly known as: BENICAR HCT Take 1 tablet by mouth daily.   omeprazole 10 MG capsule Commonly known as: PRILOSEC TAKE 1 CAPSULE(10 MG) BY MOUTH DAILY What changed: See the new instructions.   ondansetron 4 MG tablet Commonly known as: ZOFRAN Take 1 tablet (4 mg total) by  mouth every 6 (six) hours as needed for nausea.   rosuvastatin 10 MG tablet Commonly known as: CRESTOR Take 1 tablet (10 mg total) by mouth at bedtime.   senna 8.6 MG Tabs tablet Commonly known as: SENOKOT Take 1 tablet (8.6 mg total) by mouth 2 (two) times daily.   TURMERIC PO Take 1,000 mg by mouth daily.   Vitamin D (Ergocalciferol) 1.25 MG (50000 UT) Caps capsule Commonly known as: DRISDOL Take 5,000 Units by mouth once a week.         Signed: Thea Gisthomas B Melannie Metzner 10/01/2018, 7:08 AM  [facility] now [facility]  [place] [address], [place], [place] [address] Phone: [phone] Facebook  Instagram  [employer]  Twitter

## 2018-10-03 ENCOUNTER — Encounter (HOSPITAL_COMMUNITY): Payer: Self-pay | Admitting: Orthopedic Surgery

## 2018-10-14 DIAGNOSIS — Z471 Aftercare following joint replacement surgery: Secondary | ICD-10-CM | POA: Diagnosis not present

## 2018-10-14 DIAGNOSIS — Z96641 Presence of right artificial hip joint: Secondary | ICD-10-CM | POA: Diagnosis not present

## 2018-11-11 DIAGNOSIS — Z96641 Presence of right artificial hip joint: Secondary | ICD-10-CM | POA: Diagnosis not present

## 2018-11-11 DIAGNOSIS — Z471 Aftercare following joint replacement surgery: Secondary | ICD-10-CM | POA: Diagnosis not present

## 2018-11-22 DIAGNOSIS — E119 Type 2 diabetes mellitus without complications: Secondary | ICD-10-CM | POA: Diagnosis not present

## 2018-11-22 DIAGNOSIS — I10 Essential (primary) hypertension: Secondary | ICD-10-CM | POA: Diagnosis not present

## 2018-11-22 DIAGNOSIS — M1990 Unspecified osteoarthritis, unspecified site: Secondary | ICD-10-CM | POA: Diagnosis not present

## 2018-11-22 DIAGNOSIS — N189 Chronic kidney disease, unspecified: Secondary | ICD-10-CM | POA: Diagnosis not present

## 2018-11-22 DIAGNOSIS — E785 Hyperlipidemia, unspecified: Secondary | ICD-10-CM | POA: Diagnosis not present

## 2018-12-29 ENCOUNTER — Encounter: Payer: Self-pay | Admitting: Internal Medicine

## 2018-12-29 ENCOUNTER — Other Ambulatory Visit: Payer: Self-pay

## 2018-12-29 ENCOUNTER — Ambulatory Visit (INDEPENDENT_AMBULATORY_CARE_PROVIDER_SITE_OTHER): Payer: Medicare Other | Admitting: Internal Medicine

## 2018-12-29 VITALS — BP 124/64 | HR 92 | Temp 98.7°F | Ht 66.0 in | Wt 219.4 lb

## 2018-12-29 DIAGNOSIS — R7303 Prediabetes: Secondary | ICD-10-CM

## 2018-12-29 DIAGNOSIS — N183 Chronic kidney disease, stage 3 unspecified: Secondary | ICD-10-CM | POA: Diagnosis not present

## 2018-12-29 DIAGNOSIS — I129 Hypertensive chronic kidney disease with stage 1 through stage 4 chronic kidney disease, or unspecified chronic kidney disease: Secondary | ICD-10-CM

## 2018-12-29 DIAGNOSIS — Z6835 Body mass index (BMI) 35.0-35.9, adult: Secondary | ICD-10-CM

## 2018-12-29 DIAGNOSIS — Z96641 Presence of right artificial hip joint: Secondary | ICD-10-CM

## 2018-12-29 NOTE — Patient Instructions (Signed)
Preventing Influenza, Adult Influenza, more commonly known as "the flu," is a viral infection that mainly affects the respiratory tract. The respiratory tract includes structures that help you breathe, such as the lungs, nose, and throat. The flu causes many common cold symptoms, as well as a high fever and body aches. The flu spreads easily from person to person (is contagious). The flu is most common from December through March. This is called flu season.You can catch the flu virus by:  Breathing in droplets from an infected person's cough or sneeze.  Touching something that was recently contaminated with the virus and then touching your mouth, nose, or eyes. What can I do to lower my risk?        You can decrease your risk of getting the flu by:  Getting a flu shot (influenza vaccination) every year. This is the best way to prevent the flu. A flu shot is recommended for everyone age 6 months and older. ? It is best to get a flu shot in the fall, as soon as it is available. Getting a flu shot during winter or spring instead is still a good idea. Flu season can last into early spring. ? Preventing the flu through vaccination requires getting a new flu shot every year. This is because the flu virus changes slightly (mutates) from one year to the next. Even if a flu shot does not completely protect you from all flu virus mutations, it can reduce the severity of your illness and prevent dangerous complications of the flu. ? If you are pregnant, you can and should get a flu shot. ? If you have had a reaction to the shot in the past or if you are allergic to eggs, check with your health care provider before getting a flu shot. ? Sometimes the vaccine is available as a nasal spray. In some years, the nasal spray has not been as effective against the flu virus. Check with your health care provider if you have questions about this.  Practicing good health habits. This is especially important during  flu season. ? Avoid contact with people who are sick with flu or cold symptoms. ? Wash your hands with soap and water often. If soap and water are not available, use alcohol-based hand sanitizer. ? Avoid touching your hands to your face, especially when you have not washed your hands recently. ? Use a disinfectant to clean surfaces at home and at work that may be contaminated with the flu virus. ? Keep your body's disease-fighting system (immune system) in good shape by eating a healthy diet, drinking plenty of fluids, getting enough sleep, and exercising regularly. If you do get the flu, avoid spreading it to others by:  Staying home until your symptoms have been gone for at least one day.  Covering your mouth and nose when you cough or sneeze.  Avoiding close contact with others, especially babies and elderly people. Why are these changes important? Getting a flu shot and practicing good health habits protects you as well as other people. If you get the flu, your friends, family, and co-workers are also at risk of getting it, because it spreads so easily to others. Each year, about 2 out of every 10 people get the flu. Having the flu can lead to complications, such as pneumonia, ear infection, and sinus infection. The flu also can be deadly, especially for babies, people older than age 65, and people who have serious long-term diseases. How is this treated? Most   people recover from the flu by resting at home and drinking plenty of fluids. However, a prescription antiviral medicine may reduce your flu symptoms and may make your flu go away sooner. This medicine must be started within a few days of getting flu symptoms. You can talk with your health care provider about whether you need an antiviral medicine. Antiviral medicine may be prescribed for people who are at risk for more serious flu symptoms. This includes people who:  Are older than age 65.  Are pregnant.  Have a condition that  makes the flu worse or more dangerous. Where to find more information  Centers for Disease Control and Prevention: www.cdc.gov/flu/index.htm  Flu.gov: www.flu.gov/prevention-vaccination  American Academy of [employer]icians: familydoctor.org/familydoctor/en/kids/vaccines/preventing-the-flu.html Contact a health care provider if:  You have influenza and you develop new symptoms.  You have: ? Chest pain. ? Diarrhea. ? A fever.  Your cough gets worse, or you produce more mucus. Summary  The best way to prevent the flu is to get a flu shot every year in the fall.  Even if you get the flu after you have received the yearly vaccine, your flu may be milder and go away sooner because of your flu shot.  If you get the flu, antiviral medicines that are started with a few days of symptoms may reduce your flu symptoms and may make your flu go away sooner.  You can also help prevent the flu by practicing good health habits. This information is not intended to replace advice given to you by your health care provider. Make sure you discuss any questions you have with your health care provider. Document Released: 03/17/2015 Document Revised: 02/12/2017 Document Reviewed: 11/09/2015 Elsevier Patient Education  2020 Else[employer] Inc.  

## 2018-12-30 LAB — LIPID PANEL
Chol/HDL Ratio: 2.2 ratio (ref 0.0–4.4)
Cholesterol, Total: 150 mg/dL (ref 100–199)
HDL: 67 mg/dL (ref >39)
LDL Chol Calc (NIH): 67 mg/dL (ref 0–99)
Triglycerides: 86 mg/dL (ref 0–149)
VLDL Cholesterol Cal: 16 mg/dL (ref 5–40)

## 2018-12-30 LAB — CMP14+EGFR
ALT: 7 IU/L (ref 0–32)
AST: 12 IU/L (ref 0–40)
Albumin/Globulin Ratio: 1.5 (ref 1.2–2.2)
Albumin: 4.3 g/dL (ref 3.7–4.7)
Alkaline Phosphatase: 88 IU/L (ref 39–117)
BUN/Creatinine Ratio: 15 (ref 12–28)
BUN: 21 mg/dL (ref 8–27)
Bilirubin Total: 0.3 mg/dL (ref 0.0–1.2)
CO2: 23 mmol/L (ref 20–29)
Calcium: 9.4 mg/dL (ref 8.7–10.3)
Chloride: 104 mmol/L (ref 96–106)
Creatinine, Ser: 1.38 mg/dL — ABNORMAL HIGH (ref 0.57–1.00)
GFR calc Af Amer: 44 mL/min/{1.73_m2} — ABNORMAL LOW (ref >59)
GFR calc non Af Amer: 38 mL/min/{1.73_m2} — ABNORMAL LOW (ref >59)
Globulin, Total: 2.9 g/dL (ref 1.5–4.5)
Glucose: 93 mg/dL (ref 65–99)
Potassium: 4.3 mmol/L (ref 3.5–5.2)
Sodium: 141 mmol/L (ref 134–144)
Total Protein: 7.2 g/dL (ref 6.0–8.5)

## 2018-12-30 LAB — HEMOGLOBIN A1C
Est. average glucose Bld gHb Est-mCnc: 111 mg/dL
Hgb A1c MFr Bld: 5.5 % (ref 4.8–5.6)

## 2018-12-31 NOTE — Progress Notes (Signed)
Subjective:     Patient ID: Tara Guerra , female    DOB: 10/11/1945 , 73 y.o.   MRN: [id number]   Chief Complaint  Patient presents with  . Hypertension    cardiologist wants labs     HPI  She is here today for a bp check. She is followed by Dr. Terrence Dupont and he wants her to have some labwork drawn.  He has seen results in Epic, but still wants these labs drawn during her visit today. She has no specific concerns or complaints at this time. She has had a right hip replacement since her last visit. She has done well since the surgery and has been performing PT exercises at home.   Hypertension This is a chronic problem. The current episode started more than 1 year ago. The problem has been gradually improving since onset. The problem is controlled. Pertinent negatives include no blurred vision, chest pain, palpitations or shortness of breath. Risk factors for coronary artery disease include obesity and post-menopausal state. Past treatments include angiotensin blockers and diuretics. The current treatment provides moderate improvement.     Past Medical History:  Diagnosis Date  . Arthritis    hips andhands , Knees  . Chronic kidney disease   . Hypercholesteremia   . Hypertension      Family History  Problem Relation Age of Onset  . Alzheimer's disease Mother   . Heart attack Father      Current Outpatient Medications:  .  aspirin 81 MG chewable tablet, Chew 1 tablet (81 mg total) by mouth 2 (two) times daily., Disp: 60 tablet, Rfl: 1 .  docusate sodium (COLACE) 100 MG capsule, Take 1 capsule (100 mg total) by mouth 2 (two) times daily., Disp: 60 capsule, Rfl: 1 .  Menthol, Topical Analgesic, (BENGAY EX), Apply 1 application topically daily as needed (pain)., Disp: , Rfl:  .  olmesartan-hydrochlorothiazide (BENICAR HCT) 40-25 MG per tablet, Take 1 tablet by mouth daily.  , Disp: , Rfl:  .  omeprazole (PRILOSEC) 10 MG capsule, TAKE 1 CAPSULE(10 MG) BY MOUTH DAILY (Patient  taking differently: Take 10 mg by mouth daily. ), Disp: 90 capsule, Rfl: 1 .  rosuvastatin (CRESTOR) 10 MG tablet, Take 1 tablet (10 mg total) by mouth at bedtime., Disp: 90 tablet, Rfl: 1 .  senna (SENOKOT) 8.6 MG TABS tablet, Take 1 tablet (8.6 mg total) by mouth 2 (two) times daily., Disp: 120 tablet, Rfl: 0 .  TURMERIC PO, Take 1,000 mg by mouth daily., Disp: , Rfl:  .  Vitamin D, Ergocalciferol, (DRISDOL) 1.25 MG (50000 UT) CAPS capsule, Take 5,000 Units by mouth once a week., Disp: , Rfl:  .  HYDROcodone-acetaminophen (NORCO/VICODIN) 5-325 MG tablet, Take 1-2 tablets by mouth every 4 (four) hours as needed for moderate pain (pain score 4-6). (Patient not taking: Reported on 12/29/2018), Disp: 30 tablet, Rfl: 0 .  ondansetron (ZOFRAN) 4 MG tablet, Take 1 tablet (4 mg total) by mouth every 6 (six) hours as needed for nausea. (Patient not taking: Reported on 12/29/2018), Disp: 20 tablet, Rfl: 0   No Known Allergies   Review of Systems  Constitutional: Negative.   Eyes: Negative for blurred vision.  Respiratory: Negative.  Negative for shortness of breath.   Cardiovascular: Negative.  Negative for chest pain and palpitations.  Gastrointestinal: Negative.   Neurological: Negative.   Psychiatric/Behavioral: Negative.      Today's Vitals   12/29/18 1552  BP: 124/64  Pulse: 92  Temp: 98.7 F (  37.1 C)  TempSrc: Oral  SpO2: 97%  Weight: 219 lb 6.4 oz (99.5 kg)  Height: _0  (1.676 m)   Body mass index is 35.41 kg/m.   Objective:  Physical Exam Vitals signs and nursing note reviewed.  Constitutional:      Appearance: Normal appearance.  HENT:     Head: Normocephalic and atraumatic.  Cardiovascular:     Rate and Rhythm: Normal rate and regular rhythm.     Heart sounds: Normal heart sounds.  Pulmonary:     Effort: Pulmonary effort is normal.     Breath sounds: Normal breath sounds.  Skin:    General: Skin is warm.  Neurological:     General: No focal deficit present.      Mental Status: She is alert.  Psychiatric:        Mood and Affect: Mood normal.        Behavior: Behavior normal.         Assessment And Plan:     1. Hypertensive nephropathy  Chronic, well controlled. She will continue with current meds. She is encouraged to avoid adding salt to her foods. She will rto in Jan for her physical examination. A copy of her labs will be sent to Dr. Terrence Dupont for his review.   - Lipid panel - CMP14+EGFR  2. Stage 3 chronic kidney disease, unspecified whether stage 3a or 3b CKD  Chronic, yet stable. She is encouraged to stay well hydrated.   3. Pre-diabetes  HER A1C HAS BEEN ELEVATED IN THE PAST. I WILL CHECK AN A1C, BMET TODAY. SHE WAS ENCOURAGED TO AVOID SUGARY BEVERAGES AND PROCESSED FOODS INCLUDNG BREADS, RICE AND PASTA.  Patient also advised that cardiologist used diabetes code for this test. I will be sure to send him a note that she has no h/o diabetes, but that she is being followed for prediabetes.  - Hemoglobin A1c  4. Class 2 severe obesity due to excess calories with serious comorbidity and body mass index (BMI) of 35.0 to 35.9 in adult [facility])  Importance of achieving optimal weight to decrease risk of cardiovascular disease and cancers was discussed with the patient in full detail. She is encouraged to start slowly - start with 10 minutes twice daily at least three to four days per week and to gradually build to 30 minutes five days weekly. She was given tips to incorporate more activity into her daily routine - take stairs when possible, park farther away from grocery stores, etc.    Maximino Greenland, MD    THE PATIENT IS ENCOURAGED TO PRACTICE SOCIAL DISTANCING DUE TO THE COVID-19 PANDEMIC.

## 2019-01-04 DIAGNOSIS — G5622 Lesion of ulnar nerve, left upper limb: Secondary | ICD-10-CM | POA: Diagnosis not present

## 2019-01-04 DIAGNOSIS — M25522 Pain in left elbow: Secondary | ICD-10-CM | POA: Diagnosis not present

## 2019-01-15 DIAGNOSIS — Z0100 Encounter for examination of eyes and vision without abnormal findings: Secondary | ICD-10-CM | POA: Diagnosis not present

## 2019-01-15 DIAGNOSIS — E119 Type 2 diabetes mellitus without complications: Secondary | ICD-10-CM | POA: Diagnosis not present

## 2019-01-25 DIAGNOSIS — I129 Hypertensive chronic kidney disease with stage 1 through stage 4 chronic kidney disease, or unspecified chronic kidney disease: Secondary | ICD-10-CM | POA: Diagnosis not present

## 2019-01-25 DIAGNOSIS — N189 Chronic kidney disease, unspecified: Secondary | ICD-10-CM | POA: Diagnosis not present

## 2019-01-25 DIAGNOSIS — E559 Vitamin D deficiency, unspecified: Secondary | ICD-10-CM | POA: Diagnosis not present

## 2019-01-25 DIAGNOSIS — N183 Chronic kidney disease, stage 3 unspecified: Secondary | ICD-10-CM | POA: Diagnosis not present

## 2019-01-25 DIAGNOSIS — D631 Anemia in chronic kidney disease: Secondary | ICD-10-CM | POA: Diagnosis not present

## 2019-02-01 ENCOUNTER — Encounter: Payer: Self-pay | Admitting: Internal Medicine

## 2019-03-21 DIAGNOSIS — M1990 Unspecified osteoarthritis, unspecified site: Secondary | ICD-10-CM | POA: Diagnosis not present

## 2019-03-21 DIAGNOSIS — E119 Type 2 diabetes mellitus without complications: Secondary | ICD-10-CM | POA: Diagnosis not present

## 2019-03-21 DIAGNOSIS — I10 Essential (primary) hypertension: Secondary | ICD-10-CM | POA: Diagnosis not present

## 2019-03-21 DIAGNOSIS — E785 Hyperlipidemia, unspecified: Secondary | ICD-10-CM | POA: Diagnosis not present

## 2019-03-21 DIAGNOSIS — N189 Chronic kidney disease, unspecified: Secondary | ICD-10-CM | POA: Diagnosis not present

## 2019-04-05 ENCOUNTER — Ambulatory Visit: Payer: PPO

## 2019-04-05 ENCOUNTER — Ambulatory Visit: Payer: PPO | Admitting: Nurse Practitioner

## 2019-04-12 ENCOUNTER — Telehealth: Payer: Self-pay

## 2019-04-12 ENCOUNTER — Ambulatory Visit: Payer: Medicare Other

## 2019-04-12 ENCOUNTER — Ambulatory Visit: Payer: Self-pay | Admitting: Internal Medicine

## 2019-04-12 NOTE — Telephone Encounter (Signed)
This nurse attempted to reach patient in regards to no show for our scheduled AWV appointment. The mailbox is full and I am unable to leave a message.

## 2019-05-16 ENCOUNTER — Telehealth: Payer: Self-pay | Admitting: Internal Medicine

## 2019-05-16 NOTE — Telephone Encounter (Signed)
I called the pt to reschedule AWV & OV.  There was no answer, and the voicemail was full.

## 2019-05-24 ENCOUNTER — Ambulatory Visit (INDEPENDENT_AMBULATORY_CARE_PROVIDER_SITE_OTHER): Payer: Medicare Other

## 2019-05-24 ENCOUNTER — Other Ambulatory Visit: Payer: Self-pay

## 2019-05-24 ENCOUNTER — Encounter: Payer: Self-pay | Admitting: Internal Medicine

## 2019-05-24 ENCOUNTER — Ambulatory Visit (INDEPENDENT_AMBULATORY_CARE_PROVIDER_SITE_OTHER): Payer: Medicare Other | Admitting: Internal Medicine

## 2019-05-24 VITALS — BP 114/76 | HR 83 | Temp 98.9°F | Ht 66.0 in | Wt 223.6 lb

## 2019-05-24 DIAGNOSIS — I129 Hypertensive chronic kidney disease with stage 1 through stage 4 chronic kidney disease, or unspecified chronic kidney disease: Secondary | ICD-10-CM | POA: Diagnosis not present

## 2019-05-24 DIAGNOSIS — Z6836 Body mass index (BMI) 36.0-36.9, adult: Secondary | ICD-10-CM

## 2019-05-24 DIAGNOSIS — M25561 Pain in right knee: Secondary | ICD-10-CM | POA: Diagnosis not present

## 2019-05-24 DIAGNOSIS — Z0000 Encounter for general adult medical examination without abnormal findings: Secondary | ICD-10-CM

## 2019-05-24 DIAGNOSIS — N183 Chronic kidney disease, stage 3 unspecified: Secondary | ICD-10-CM

## 2019-05-24 NOTE — Patient Instructions (Signed)
Tara Guerra , Thank you for taking time to come for your Medicare Wellness Visit. I appreciate your ongoing commitment to your health goals. Please review the following plan we discussed and let me know if I can assist you in the future.   Screening recommendations/referrals: Colonoscopy: 08/2016 Mammogram: 12/2017 Bone Density: 03/2016 Recommended yearly ophthalmology/optometry visit for glaucoma screening and checkup Recommended yearly dental visit for hygiene and checkup  Vaccinations: Influenza vaccine: 10/2018 Pneumococcal vaccine: postponed due to covid vaccine Tdap vaccine: 07/2011 Shingles vaccine: discussed    Advanced directives: Advance directive discussed with you today. I have provided a copy for you to complete at home and have notarized. Once this is complete please bring a copy in to our office so we can scan it into your chart.  Conditions/risks identified: obesity  Next appointment: 09/25/2019 at 2:00   Preventive Care 65 Years and Older, Female Preventive care refers to lifestyle choices and visits with your health care provider that can promote health and wellness. What does preventive care include?  A yearly physical exam. This is also called an annual well check.  Dental exams once or twice a year.  Routine eye exams. Ask your health care provider how often you should have your eyes checked.  Personal lifestyle choices, including:  Daily care of your teeth and gums.  Regular physical activity.  Eating a healthy diet.  Avoiding tobacco and drug use.  Limiting alcohol use.  Practicing safe sex.  Taking low-dose aspirin every day.  Taking vitamin and mineral supplements as recommended by your health care provider. What happens during an annual well check? The services and screenings done by your health care provider during your annual well check will depend on your age, overall health, lifestyle risk factors, and family history of disease. Counseling    Your health care provider may ask you questions about your:  Alcohol use.  Tobacco use.  Drug use.  Emotional well-being.  Home and relationship well-being.  Sexual activity.  Eating habits.  History of falls.  Memory and ability to understand (cognition).  Work and work Astronomer.  Reproductive health. Screening  You may have the following tests or measurements:  Height, weight, and BMI.  Blood pressure.  Lipid and cholesterol levels. These may be checked every 5 years, or more frequently if you are over 58 years old.  Skin check.  Lung cancer screening. You may have this screening every year starting at age 60 if you have a 30-pack-year history of smoking and currently smoke or have quit within the past 15 years.  Fecal occult blood test (FOBT) of the stool. You may have this test every year starting at age 66.  Flexible sigmoidoscopy or colonoscopy. You may have a sigmoidoscopy every 5 years or a colonoscopy every 10 years starting at age 27.  Hepatitis C blood test.  Hepatitis B blood test.  Sexually transmitted disease (STD) testing.  Diabetes screening. This is done by checking your blood sugar (glucose) after you have not eaten for a while (fasting). You may have this done every 1-3 years.  Bone density scan. This is done to screen for osteoporosis. You may have this done starting at age 75.  Mammogram. This may be done every 1-2 years. Talk to your health care provider about how often you should have regular mammograms. Talk with your health care provider about your test results, treatment options, and if necessary, the need for more tests. Vaccines  Your health care provider may recommend certain  vaccines, such as:  Influenza vaccine. This is recommended every year.  Tetanus, diphtheria, and acellular pertussis (Tdap, Td) vaccine. You may need a Td booster every 10 years.  Zoster vaccine. You may need this after age 72.  Pneumococcal 13-valent  conjugate (PCV13) vaccine. One dose is recommended after age 58.  Pneumococcal polysaccharide (PPSV23) vaccine. One dose is recommended after age 74. Talk to your health care provider about which screenings and vaccines you need and how often you need them. This information is not intended to replace advice given to you by your health care provider. Make sure you discuss any questions you have with your health care provider. Document Released: 03/29/2015 Document Revised: 11/20/2015 Document Reviewed: 01/01/2015 Elsevier Interactive Patient Education  2017 [place] Prevention in the Home Falls can cause injuries. They can happen to people of all ages. There are many things you can do to make your home safe and to help prevent falls. What can I do on the outside of my home?  Regularly fix the edges of walkways and driveways and fix any cracks.  Remove anything that might make you trip as you walk through a door, such as a raised step or threshold.  Trim any bushes or trees on the path to your home.  Use bright outdoor lighting.  Clear any walking paths of anything that might make someone trip, such as rocks or tools.  Regularly check to see if handrails are loose or broken. Make sure that both sides of any steps have handrails.  Any raised decks and porches should have guardrails on the edges.  Have any leaves, snow, or ice cleared regularly.  Use sand or salt on walking paths during winter.  Clean up any spills in your garage right away. This includes oil or grease spills. What can I do in the bathroom?  Use night lights.  Install grab bars by the toilet and in the tub and shower. Do not use towel bars as grab bars.  Use non-skid mats or decals in the tub or shower.  If you need to sit down in the shower, use a plastic, non-slip stool.  Keep the floor dry. Clean up any water that spills on the floor as soon as it happens.  Remove soap buildup in the tub or  shower regularly.  Attach bath mats securely with double-sided non-slip rug tape.  Do not have throw rugs and other things on the floor that can make you trip. What can I do in the bedroom?  Use night lights.  Make sure that you have a light by your bed that is easy to reach.  Do not use any sheets or blankets that are too big for your bed. They should not hang down onto the floor.  Have a firm chair that has side arms. You can use this for support while you get dressed.  Do not have throw rugs and other things on the floor that can make you trip. What can I do in the kitchen?  Clean up any spills right away.  Avoid walking on wet floors.  Keep items that you use a lot in easy-to-reach places.  If you need to reach something above you, use a strong step stool that has a grab bar.  Keep electrical cords out of the way.  Do not use floor polish or wax that makes floors slippery. If you must use wax, use non-skid floor wax.  Do not have throw rugs and other things  on the floor that can make you trip. What can I do with my stairs?  Do not leave any items on the stairs.  Make sure that there are handrails on both sides of the stairs and use them. Fix handrails that are broken or loose. Make sure that handrails are as long as the stairways.  Check any carpeting to make sure that it is firmly attached to the stairs. Fix any carpet that is loose or worn.  Avoid having throw rugs at the top or bottom of the stairs. If you do have throw rugs, attach them to the floor with carpet tape.  Make sure that you have a light switch at the top of the stairs and the bottom of the stairs. If you do not have them, ask someone to add them for you. What else can I do to help prevent falls?  Wear shoes that:  Do not have high heels.  Have rubber bottoms.  Are comfortable and fit you well.  Are closed at the toe. Do not wear sandals.  If you use a stepladder:  Make sure that it is fully  opened. Do not climb a closed stepladder.  Make sure that both sides of the stepladder are locked into place.  Ask someone to hold it for you, if possible.  Clearly mark and make sure that you can see:  Any grab bars or handrails.  First and last steps.  Where the edge of each step is.  Use tools that help you move around (mobility aids) if they are needed. These include:  Canes.  Walkers.  Scooters.  Crutches.  Turn on the lights when you go into a dark area. Replace any light bulbs as soon as they burn out.  Set up your furniture so you have a clear path. Avoid moving your furniture around.  If any of your floors are uneven, fix them.  If there are any pets around you, be aware of where they are.  Review your medicines with your doctor. Some medicines can make you feel dizzy. This can increase your chance of falling. Ask your doctor what other things that you can do to help prevent falls. This information is not intended to replace advice given to you by your health care provider. Make sure you discuss any questions you have with your health care provider. Document Released: 12/27/2008 Document Revised: 08/08/2015 Document Reviewed: 04/06/2014 Elsevier Interactive Patient Education  2017 [employer].

## 2019-05-24 NOTE — Progress Notes (Signed)
This visit occurred during the SARS-CoV-2 public health emergency.  Safety protocols were in place, including screening questions prior to the visit, additional usage of staff PPE, and extensive cleaning of exam room while observing appropriate contact time as indicated for disinfecting solutions.  Subjective:     Patient ID: Tara Guerra , female    DOB: 1945/08/23 , 74 y.o.   MRN: [id number]   Chief Complaint  Patient presents with  . Hypertension    HPI  Hypertension This is a chronic problem. The current episode started more than 1 year ago. The problem has been gradually improving since onset. The problem is controlled. Pertinent negatives include no blurred vision, chest pain, palpitations or shortness of breath. The current treatment provides moderate improvement. Hypertensive end-organ damage includes kidney disease.     Past Medical History:  Diagnosis Date  . Arthritis    hips andhands , Knees  . Chronic kidney disease   . Hypercholesteremia   . Hypertension      Family History  Problem Relation Age of Onset  . Alzheimer's disease Mother   . Heart attack Father      Current Outpatient Medications:  Marland Kitchen  Menthol, Topical Analgesic, (BENGAY EX), Apply 1 application topically daily as needed (pain)., Disp: , Rfl:  .  olmesartan-hydrochlorothiazide (BENICAR HCT) 40-25 MG per tablet, Take 1 tablet by mouth daily.  , Disp: , Rfl:  .  omeprazole (PRILOSEC) 10 MG capsule, TAKE 1 CAPSULE(10 MG) BY MOUTH DAILY (Patient taking differently: Take 10 mg by mouth daily. ), Disp: 90 capsule, Rfl: 1 .  rosuvastatin (CRESTOR) 10 MG tablet, Take 1 tablet (10 mg total) by mouth at bedtime., Disp: 90 tablet, Rfl: 1 .  Vitamin D, Ergocalciferol, (DRISDOL) 1.25 MG (50000 UT) CAPS capsule, Take 5,000 Units by mouth once a week., Disp: , Rfl:    No Known Allergies   Review of Systems  Constitutional: Negative.   Eyes: Negative for blurred vision.  Respiratory: Negative.  Negative  for shortness of breath.   Cardiovascular: Negative.  Negative for chest pain and palpitations.  Gastrointestinal: Negative.   Musculoskeletal: Positive for arthralgias.       She c/o r knee pain. There is some pain with ambulation. She denies fall/trauma.   Neurological: Negative.   Psychiatric/Behavioral: Negative.      Today's Vitals   05/24/19 1535  BP: 114/76  Pulse: 83  Temp: 98.9 F (37.2 C)  TempSrc: Oral  Weight: 223 lb 9.6 oz (101.4 kg)  Height: '5\' 6"'  (1.676 m)  PainSc: 7   PainLoc: Knee   Body mass index is 36.09 kg/m.   Wt Readings from Last 3 Encounters:  05/24/19 223 lb 9.6 oz (101.4 kg)  05/24/19 223 lb 9.6 oz (101.4 kg)  12/29/18 219 lb 6.4 oz (99.5 kg)     Objective:  Physical Exam Vitals and nursing note reviewed.  Constitutional:      Appearance: Normal appearance. She is obese.  HENT:     Head: Normocephalic and atraumatic.  Cardiovascular:     Rate and Rhythm: Normal rate and regular rhythm.     Heart sounds: Normal heart sounds.  Pulmonary:     Effort: Pulmonary effort is normal.     Breath sounds: Normal breath sounds.  Skin:    General: Skin is warm.  Neurological:     General: No focal deficit present.     Mental Status: She is alert.  Psychiatric:        Mood  and Affect: Mood normal.        Behavior: Behavior normal.         Assessment And Plan:     1. Hypertensive nephropathy  Chronic, well controlled. She will continue with current meds. She is encouraged to avoid adding salt to her foods. I will check renal function.   - BMP8+EGFR  2. Stage 3 chronic kidney disease, unspecified whether stage 3a or 3b CKD  Chronic. Pt is aware of need for adequate hydration and optimal BP control to prevent progression of CKD.   3. Recurrent pain of right knee  Chronic. She is advised to also use OTC Voltaren gel on affected area bid-tid. She will let me know if her sx persist. If so, I will refer her to Ortho for further evaluation.    4. Class 2 severe obesity due to excess calories with serious comorbidity and body mass index (BMI) of 36.0 to 36.9 in adult [facility])  She is encouraged to strive for BMI less than 30 to decrease cardiac risk. She is encouraged to increase daily activity as tolerated.  Maximino Greenland, MD    THE PATIENT IS ENCOURAGED TO PRACTICE SOCIAL DISTANCING DUE TO THE COVID-19 PANDEMIC.

## 2019-05-24 NOTE — Progress Notes (Signed)
This visit occurred during the SARS-CoV-2 public health emergency.  Safety protocols were in place, including screening questions prior to the visit, additional usage of staff PPE, and extensive cleaning of exam room while observing appropriate contact time as indicated for disinfecting solutions.  Subjective:   Tara Guerra is a 74 y.o. female who presents for Medicare Annual (Subsequent) preventive examination.  Review of Systems:  n/a Cardiac Risk Factors include: advanced age (>10men, >62 women);hypertension;dyslipidemia;obesity (BMI >30kg/m2);sedentary lifestyle     Objective:     Vitals: BP 114/76   Pulse 83   Temp 98.9 F (37.2 C) (Oral)   Ht 5\' 6"  (1.676 m)   Wt 223 lb 9.6 oz (101.4 kg)   BMI 36.09 kg/m   Body mass index is 36.09 kg/m.  Advanced Directives 05/24/2019 09/29/2018 09/26/2018 03/30/2018 11/18/2014  Does Patient Have a Medical Advance Directive? No No No Yes No  Does patient want to make changes to medical advance directive? - - - No - Patient declined -  Would patient like information on creating a medical advance directive? Yes (MAU/Ambulatory/Procedural Areas - Information given) No - Patient declined No - Patient declined - Yes - 01/18/2015 given;Yes - Spiritual care consult ordered    Tobacco Social History   Tobacco Use  Smoking Status Never Smoker  Smokeless Tobacco Never Used     Counseling given: Not Answered   Clinical Intake:  Pre-visit preparation completed: Yes  Pain : 0-10 Pain Score: 7  Pain Type: Acute pain Pain Location: Knee Pain Orientation: Right Pain Radiating Towards: radiating from hip to knee Pain Descriptors / Indicators: Sharp Pain Onset: In the past 7 days Pain Frequency: Intermittent Pain Relieving Factors: rest helps  Pain Relieving Factors: rest helps  Nutritional Status: BMI > 30  Obese Nutritional Risks: None Diabetes: No  How often do you need to have someone help you when you read  instructions, pamphlets, or other written materials from your doctor or pharmacy?: 1 - Never What is the last grade level you completed in school?: 11th grade  Interpreter Needed?: No  Information entered by :: NAllen LPN  Past Medical History:  Diagnosis Date  . Arthritis    hips andhands , Knees  . Chronic kidney disease   . Hypercholesteremia   . Hypertension    Past Surgical History:  Procedure Laterality Date  . COLONOSCOPY  2019  . TOTAL HIP ARTHROPLASTY Right 09/29/2018   Procedure: TOTAL HIP ARTHROPLASTY ANTERIOR APPROACH;  Surgeon: 10/01/2018, MD;  Location: [facility];  Service: Orthopedics;  Laterality: Right;  Samson Frederic VASCULAR SURGERY Right    Varicose veins stripping   Family History  Problem Relation Age of Onset  . Alzheimer's disease Mother   . Heart attack Father    Social History   Socioeconomic History  . Marital status: Widowed    Spouse name: Not on file  . Number of children: Not on file  . Years of education: Not on file  . Highest education level: Not on file  Occupational History  . Not on file  Tobacco Use  . Smoking status: Never Smoker  . Smokeless tobacco: Never Used  Substance and Sexual Activity  . Alcohol use: No  . Drug use: No  . Sexual activity: Not Currently  Other Topics Concern  . Not on file  Social History Narrative  . Not on file   Social Determinants of Health   Financial Resource Strain: Low Risk   . Difficulty of Paying Living Expenses:  Not hard at all  Food Insecurity: No Food Insecurity  . Worried About Charity fundraiser in the Last Year: Never true  . Ran Out of Food in the Last Year: Never true  Transportation Needs: No Transportation Needs  . Lack of Transportation (Medical): No  . Lack of Transportation (Non-Medical): No  Physical Activity: Insufficiently Active  . Days of Exercise per Week: 7 days  . Minutes of Exercise per Session: 10 min  Stress: No Stress Concern Present  . Feeling of Stress : Not at all   Social Connections:   . Frequency of Communication with Friends and Family: Not on file  . Frequency of Social Gatherings with Friends and Family: Not on file  . Attends Religious Services: Not on file  . Active Member of Clubs or Organizations: Not on file  . Attends Archivist Meetings: Not on file  . Marital Status: Not on file    Outpatient Encounter Medications as of 05/24/2019  Medication Sig  . Menthol, Topical Analgesic, (BENGAY EX) Apply 1 application topically daily as needed (pain).  Marland Kitchen olmesartan-hydrochlorothiazide (BENICAR HCT) 40-25 MG per tablet Take 1 tablet by mouth daily.    . rosuvastatin (CRESTOR) 10 MG tablet Take 1 tablet (10 mg total) by mouth at bedtime.  . [DISCONTINUED] omeprazole (PRILOSEC) 10 MG capsule TAKE 1 CAPSULE(10 MG) BY MOUTH DAILY (Patient taking differently: Take 10 mg by mouth daily. )  . [DISCONTINUED] Vitamin D, Ergocalciferol, (DRISDOL) 1.25 MG (50000 UT) CAPS capsule Take 5,000 Units by mouth once a week.   No facility-administered encounter medications on file as of 05/24/2019.    Activities of Daily Living In your present state of health, do you have any difficulty performing the following activities: 05/24/2019 09/29/2018  Hearing? N N  Vision? N N  Difficulty concentrating or making decisions? N N  Walking or climbing stairs? N N  Dressing or bathing? N N  Doing errands, shopping? N N  Preparing Food and eating ? N -  Using the Toilet? N -  In the past six months, have you accidently leaked urine? N -  Do you have problems with loss of bowel control? N -  Managing your Medications? N -  Managing your Finances? N -  Housekeeping or managing your Housekeeping? N -  Some recent data might be hidden    Patient Care Team: Glendale Chard, MD as PCP - General (Internal Medicine)    Assessment:   This is a routine wellness examination for Tara Guerra.  Exercise Activities and Dietary recommendations Current Exercise Habits: Home  exercise routine, Type of exercise: walking, Time (Minutes): 10, Frequency (Times/Week): 7, Weekly Exercise (Minutes/Week): 70  Goals    . DIET - INCREASE WATER INTAKE (pt-stated)     Eat healthy    . Weight (lb) < 200 lb (90.7 kg)     05/24/2019, wants to get to 178 pounds       Fall Risk Fall Risk  05/24/2019 12/29/2018 09/19/2018 08/17/2018 05/11/2018  Falls in the past year? 0 0 0 0 0  Risk for fall due to : Medication side effect - - - -  Follow up Falls evaluation completed;Education provided;Falls prevention discussed - - - -   Is the patient's home free of loose throw rugs in walkways, pet beds, electrical cords, etc?   yes      Grab bars in the bathroom? yes      Handrails on the stairs?   yes  Adequate lighting?   yes  Timed Get Up and Go performed: n/a  Depression Screen PHQ 2/9 Scores 05/24/2019 12/29/2018 09/19/2018 08/17/2018  PHQ - 2 Score 0 0 0 0  PHQ- 9 Score 0 - - -     Cognitive Function     6CIT Screen 05/24/2019 03/30/2018  What Year? 0 points 0 points  What month? 0 points 0 points  What time? 3 points 0 points  Count back from 20 2 points 0 points  Months in reverse 4 points 2 points  Repeat phrase 2 points 4 points  Total Score 11 6    Immunization History  Administered Date(s) Administered  . Influenza Whole 12/15/2007, 01/24/2009, 01/09/2010  . Influenza, High Dose Seasonal PF 11/11/2018  . Influenza,inj,Quad PF,6+ Mos 02/03/2015  . Influenza-Unspecified 12/14/2017  . Td 12/15/2007    Qualifies for Shingles Vaccine? yes  Screening Tests Health Maintenance  Topic Date Due  . Hepatitis C Screening  05/23/2020 (Originally 1945/05/30)  . PNA vac Low Risk Adult (2 of 2 - PPSV23) 05/23/2020 (Originally 08/22/2014)  . MAMMOGRAM  01/06/2020  . TETANUS/TDAP  07/14/2021  . COLONOSCOPY  08/22/2026  . INFLUENZA VACCINE  Completed  . DEXA SCAN  Completed    Cancer Screenings: Lung: Low Dose CT Chest recommended if Age 59-80 years, 30 pack-year  currently smoking OR have quit w/in 15years. Patient does not qualify. Breast:  Up to date on Mammogram? Yes   Up to date of Bone Density/Dexa? Yes Colorectal: up to date  Additional Screenings: : Hepatitis C Screening: declined     Plan:    Patient wants to get to 178 pounds.   I have personally reviewed and noted the following in the patient's chart:   . Medical and social history . Use of alcohol, tobacco or illicit drugs  . Current medications and supplements . Functional ability and status . Nutritional status . Physical activity . Advanced directives . List of other physicians . Hospitalizations, surgeries, and ER visits in previous 12 months . Vitals . Screenings to include cognitive, depression, and falls . Referrals and appointments  In addition, I have reviewed and discussed with patient certain preventive protocols, quality metrics, and best practice recommendations. A written personalized care plan for preventive services as well as general preventive health recommendations were provided to patient.     Barb Merino, LPN  3/[id number]

## 2019-05-24 NOTE — Patient Instructions (Addendum)
Voltaren gel - you may get over the counter.    Acute Knee Pain, Adult Many things can cause knee pain. Sometimes, knee pain is sudden (acute) and may be caused by damage, swelling, or irritation of the muscles and tissues that support your knee. The pain often goes away on its own with time and rest. If the pain does not go away, tests may be done to find out what is causing the pain. Follow these instructions at home: Pay attention to any changes in your symptoms. Take these actions to relieve your pain. If you have a knee sleeve or brace:   Wear the sleeve or brace as told by your doctor. Remove it only as told by your doctor.  Loosen the sleeve or brace if your toes: ? Tingle. ? Become numb. ? Turn cold and blue.  Keep the sleeve or brace clean.  If the sleeve or brace is not waterproof: ? Do not let it get wet. ? Cover it with a watertight covering when you take a bath or shower. Activity  Rest your knee.  Do not do things that cause pain.  Avoid activities where both feet leave the ground at the same time (high-impact activities). Examples are running, jumping rope, and doing jumping jacks.  Work with a physical therapist to make a safe exercise program, as told by your doctor. Managing pain, stiffness, and swelling   If told, put ice on the knee: ? Put ice in a plastic bag. ? Place a towel between your skin and the bag. ? Leave the ice on for 20 minutes, 2-3 times a day.  If told, put pressure (compression) on your injured knee to control swelling, give support, and help with discomfort. Compression may be done with an elastic bandage. General instructions  Take all medicines only as told by your doctor.  Raise (elevate) your knee while you are sitting or lying down. Make sure your knee is higher than your heart.  Sleep with a pillow under your knee.  Do not use any products that contain nicotine or tobacco. These include cigarettes, e-cigarettes, and chewing  tobacco. These products may slow down healing. If you need help quitting, ask your doctor.  If you are overweight, work with your doctor and a food expert (dietitian) to set goals to lose weight. Being overweight can make your knee hurt more.  Keep all follow-up visits as told by your doctor. This is important. Contact a doctor if:  The knee pain does not stop.  The knee pain changes or gets worse.  You have a fever along with knee pain.  Your knee feels warm when you touch it.  Your knee gives out or locks up. Get help right away if:  Your knee swells, and the swelling gets worse.  You cannot move your knee.  You have very bad knee pain. Summary  Many things can cause knee pain. The pain often goes away on its own with time and rest.  Your doctor may do tests to find out the cause of the pain.  Pay attention to any changes in your symptoms. Relieve your pain with rest, medicines, light activity, and use of ice.  Get help right away if you cannot move your knee or your knee pain is very bad. This information is not intended to replace advice given to you by your health care provider. Make sure you discuss any questions you have with your health care provider. Document Revised: 08/12/2017 Document Reviewed: 08/12/2017  Elsevier Patient Education  [employer].

## 2019-05-25 LAB — BMP8+EGFR
BUN/Creatinine Ratio: 16 (ref 12–28)
BUN: 24 mg/dL (ref 8–27)
CO2: 22 mmol/L (ref 20–29)
Calcium: 9.6 mg/dL (ref 8.7–10.3)
Chloride: 103 mmol/L (ref 96–106)
Creatinine, Ser: 1.52 mg/dL — ABNORMAL HIGH (ref 0.57–1.00)
GFR calc Af Amer: 39 mL/min/{1.73_m2} — ABNORMAL LOW (ref >59)
GFR calc non Af Amer: 34 mL/min/{1.73_m2} — ABNORMAL LOW (ref >59)
Glucose: 87 mg/dL (ref 65–99)
Potassium: 4 mmol/L (ref 3.5–5.2)
Sodium: 142 mmol/L (ref 134–144)

## 2019-05-30 DIAGNOSIS — H40023 Open angle with borderline findings, high risk, bilateral: Secondary | ICD-10-CM | POA: Diagnosis not present

## 2019-06-13 ENCOUNTER — Other Ambulatory Visit: Payer: Self-pay

## 2019-06-13 ENCOUNTER — Other Ambulatory Visit: Payer: Self-pay | Admitting: Internal Medicine

## 2019-06-13 DIAGNOSIS — E785 Hyperlipidemia, unspecified: Secondary | ICD-10-CM

## 2019-06-13 NOTE — Telephone Encounter (Signed)
The pt was notified of her most recent lab results. 

## 2019-06-22 DIAGNOSIS — N189 Chronic kidney disease, unspecified: Secondary | ICD-10-CM | POA: Diagnosis not present

## 2019-06-22 DIAGNOSIS — E785 Hyperlipidemia, unspecified: Secondary | ICD-10-CM | POA: Diagnosis not present

## 2019-06-22 DIAGNOSIS — I10 Essential (primary) hypertension: Secondary | ICD-10-CM | POA: Diagnosis not present

## 2019-06-22 DIAGNOSIS — M1990 Unspecified osteoarthritis, unspecified site: Secondary | ICD-10-CM | POA: Diagnosis not present

## 2019-06-22 DIAGNOSIS — K219 Gastro-esophageal reflux disease without esophagitis: Secondary | ICD-10-CM | POA: Diagnosis not present

## 2019-09-19 ENCOUNTER — Ambulatory Visit (INDEPENDENT_AMBULATORY_CARE_PROVIDER_SITE_OTHER): Payer: Medicare Other | Admitting: Internal Medicine

## 2019-09-19 ENCOUNTER — Other Ambulatory Visit: Payer: Self-pay

## 2019-09-19 ENCOUNTER — Encounter: Payer: Self-pay | Admitting: Internal Medicine

## 2019-09-19 VITALS — BP 124/82 | HR 82 | Temp 98.1°F | Ht 66.0 in | Wt 226.2 lb

## 2019-09-19 DIAGNOSIS — E7800 Pure hypercholesterolemia, unspecified: Secondary | ICD-10-CM

## 2019-09-19 DIAGNOSIS — R2242 Localized swelling, mass and lump, left lower limb: Secondary | ICD-10-CM | POA: Diagnosis not present

## 2019-09-19 DIAGNOSIS — M109 Gout, unspecified: Secondary | ICD-10-CM | POA: Diagnosis not present

## 2019-09-19 MED ORDER — COLCHICINE 0.6 MG PO TABS: 0.6000 mg | ORAL_TABLET | Freq: Every day | ORAL | 2 refills | Status: DC

## 2019-09-19 MED ORDER — TRIAMCINOLONE ACETONIDE 40 MG/ML IJ SUSP
40.0000 mg | Freq: Once | INTRAMUSCULAR | Status: AC
  Administered 2019-09-19: 40 mg via INTRAMUSCULAR

## 2019-09-19 NOTE — Patient Instructions (Signed)
 Gout  Gout is painful swelling of your joints. Gout is a type of arthritis. It is caused by having too much uric acid in your body. Uric acid is a chemical that is made when your body breaks down substances called purines. If your body has too much uric acid, sharp crystals can form and build up in your joints. This causes pain and swelling. Gout attacks can happen quickly and be very painful (acute gout). Over time, the attacks can affect more joints and happen more often (chronic gout). What are the causes?  Too much uric acid in your blood. This can happen because: ? Your kidneys do not remove enough uric acid from your blood. ? Your body makes too much uric acid. ? You eat too many foods that are high in purines. These foods include organ meats, some seafood, and beer.  Trauma or stress. What increases the risk?  Having a family history of gout.  Being female and middle-aged.  Being female and having gone through menopause.  Being very overweight (obese).  Drinking alcohol, especially beer.  Not having enough water in the body (being dehydrated).  Losing weight too quickly.  Having an organ transplant.  Having lead poisoning.  Taking certain medicines.  Having kidney disease.  Having a skin condition called psoriasis. What are the signs or symptoms? An attack of acute gout usually happens in just one joint. The most common place is the big toe. Attacks often start at night. Other joints that may be affected include joints of the feet, ankle, knee, fingers, wrist, or elbow. Symptoms of an attack may include:  Very bad pain.  Warmth.  Swelling.  Stiffness.  Shiny, red, or purple skin.  Tenderness. The affected joint may be very painful to touch.  Chills and fever. Chronic gout may cause symptoms more often. More joints may be involved. You may also have white or yellow lumps (tophi) on your hands or feet or in other areas near your joints. How is this  treated?  Treatment for this condition has two phases: treating an acute attack and preventing future attacks.  Acute gout treatment may include: ? NSAIDs. ? Steroids. These are taken by mouth or injected into a joint. ? Colchicine. This medicine relieves pain and swelling. It can be given by mouth or through an IV tube.  Preventive treatment may include: ? Taking small doses of NSAIDs or colchicine daily. ? Using a medicine that reduces uric acid levels in your blood. ? Making changes to your diet. You may need to see a food expert (dietitian) about what to eat and drink to prevent gout. Follow these instructions at home: During a gout attack   If told, put ice on the painful area: ? Put ice in a plastic bag. ? Place a towel between your skin and the bag. ? Leave the ice on for 20 minutes, 2-3 times a day.  Raise (elevate) the painful joint above the level of your heart as often as you can.  Rest the joint as much as possible. If the joint is in your leg, you may be given crutches.  Follow instructions from your doctor about what you cannot eat or drink. Avoiding future gout attacks  Eat a low-purine diet. Avoid foods and drinks such as: ? Liver. ? Kidney. ? Anchovies. ? Asparagus. ? Herring. ? Mushrooms. ? Mussels. ? Beer.  Stay at a healthy weight. If you want to lose weight, talk with your doctor. Do not lose   weight too fast.  Start or continue an exercise plan as told by your doctor. Eating and drinking  Drink enough fluids to keep your pee (urine) pale yellow.  If you drink alcohol: ? Limit how much you use to:  0-1 drink a day for women.  0-2 drinks a day for men. ? Be aware of how much alcohol is in your drink. In the [place] one drink equals one 12 oz bottle of beer (355 mL), one 5 oz glass of wine (148 mL), or one 1 oz glass of hard liquor (44 mL). General instructions  Take over-the-counter and prescription medicines only as told by your doctor.  Do  not drive or use heavy machinery while taking prescription pain medicine.  Return to your normal activities as told by your doctor. Ask your doctor what activities are safe for you.  Keep all follow-up visits as told by your doctor. This is important. Contact a doctor if:  You have another gout attack.  You still have symptoms of a gout attack after 10 days of treatment.  You have problems (side effects) because of your medicines.  You have chills or a fever.  You have burning pain when you pee (urinate).  You have pain in your lower back or belly. Get help right away if:  You have very bad pain.  Your pain cannot be controlled.  You cannot pee. Summary  Gout is painful swelling of the joints.  The most common site of pain is the big toe, but it can affect other joints.  Medicines and avoiding some foods can help to prevent and treat gout attacks. This information is not intended to replace advice given to you by your health care provider. Make sure you discuss any questions you have with your health care provider. Document Revised: 09/22/2017 Document Reviewed: 09/22/2017 Elsevier Patient Education  2020 [employer]  

## 2019-09-19 NOTE — Progress Notes (Signed)
This visit occurred during the SARS-CoV-2 public health emergency.  Safety protocols were in place, including screening questions prior to the visit, additional usage of staff PPE, and extensive cleaning of exam room while observing appropriate contact time as indicated for disinfecting solutions.  Subjective:     Patient ID: Tara Guerra , female    DOB: 17-Apr-1945 , 74 y.o.   MRN: [id number]   Chief Complaint  Patient presents with  . Edema    left foot    HPI  She presents today for further evaluation of left foot swelling. She reports her symptoms started about 2 weeks ago. She reports her big toe throbs. There is some pain with ambulation.  Her sx started about 2 weeks ago.  She does not have discomfort when lying in bed, even with sheets on top of her.  She states "everyone in my family has the gout, they say this is what this is".  She admits to having shellfish recently, just before her symptoms started. She reports eating [employer] three times in one week.     Past Medical History:  Diagnosis Date  . Arthritis    hips andhands , Knees  . Chronic kidney disease   . Hypercholesteremia   . Hypertension      Family History  Problem Relation Age of Onset  . Alzheimer's disease Mother   . Heart attack Father      Current Outpatient Medications:  Marland Kitchen  Menthol, Topical Analgesic, (BENGAY EX), Apply 1 application topically daily as needed (pain)., Disp: , Rfl:  .  olmesartan-hydrochlorothiazide (BENICAR HCT) 40-25 MG tablet, TAKE 1 TABLET BY MOUTH EVERY DAY, Disp: 90 tablet, Rfl: 1 .  rosuvastatin (CRESTOR) 10 MG tablet, TAKE 1 TABLET(10 MG) BY MOUTH AT BEDTIME, Disp: 90 tablet, Rfl: 1   No Known Allergies   Review of Systems  Constitutional: Negative.   Respiratory: Negative.   Cardiovascular: Negative.   Gastrointestinal: Negative.   Musculoskeletal: Positive for arthralgias.  Neurological: Negative.   Psychiatric/Behavioral: Negative.      Today's Vitals    09/19/19 1157  BP: 124/82  Pulse: 82  Temp: 98.1 F (36.7 C)  TempSrc: Oral  Weight: 226 lb 3.2 oz (102.6 kg)  Height: 5\' 6"  (1.676 m)  PainSc: 0-No pain   Body mass index is 36.51 kg/m.   Objective:  Physical Exam Vitals and nursing note reviewed.  Constitutional:      Appearance: Normal appearance.  HENT:     Head: Normocephalic and atraumatic.  Cardiovascular:     Rate and Rhythm: Normal rate and regular rhythm.     Heart sounds: Normal heart sounds.  Pulmonary:     Effort: Pulmonary effort is normal.     Breath sounds: Normal breath sounds.  Musculoskeletal:     Comments: She has left foot swelling, there is warmth/swelling/tenderness of left 1st MTP joint. There is no ankle swelling.   Skin:    General: Skin is warm.  Neurological:     General: No focal deficit present.     Mental Status: She is alert.  Psychiatric:        Mood and Affect: Mood normal.        Behavior: Behavior normal.         Assessment And Plan:     1. Localized swelling of left foot  Her sx are suggestive of gout. I will check uric acid level today. She was given Kenalog, 40mg  IM x1. She was also given rx  colchicine to take once daily. She agrees to rto in four to six weeks for re-evaluation.   - Uric acid - triamcinolone acetonide (KENALOG-40) injection 40 mg  2. Podagra  Please see above, #1.   3. Pure hypercholesterolemia  Pt reports she is almost out of her rx for rosuvastatin and needs a refill. Pt advised that this medication interacts with colchicine. She agrees to switch to new cholesterol medication. She will stop rosuvastatin immediately. She verbalizes understanding of her treatment plan. I will send in new rx for pravastatin, 80mg  daily.    , MD    THE PATIENT IS ENCOURAGED TO PRACTICE SOCIAL DISTANCING DUE TO THE COVID-19 PANDEMIC.

## 2019-09-20 ENCOUNTER — Other Ambulatory Visit: Payer: Self-pay

## 2019-09-20 LAB — URIC ACID: Uric Acid: 8.6 mg/dL — ABNORMAL HIGH (ref 3.1–7.9)

## 2019-09-20 NOTE — Telephone Encounter (Signed)
Called and said that she has taken her last pill of rosuvastatin and that Dr. Allyne Gee wanted to know so that she can send in a new medication.

## 2019-09-25 ENCOUNTER — Encounter: Payer: Medicare Other | Admitting: Internal Medicine

## 2019-09-25 DIAGNOSIS — I10 Essential (primary) hypertension: Secondary | ICD-10-CM | POA: Diagnosis not present

## 2019-09-25 DIAGNOSIS — M1990 Unspecified osteoarthritis, unspecified site: Secondary | ICD-10-CM | POA: Diagnosis not present

## 2019-09-25 DIAGNOSIS — E785 Hyperlipidemia, unspecified: Secondary | ICD-10-CM | POA: Diagnosis not present

## 2019-09-25 DIAGNOSIS — N189 Chronic kidney disease, unspecified: Secondary | ICD-10-CM | POA: Diagnosis not present

## 2019-10-03 ENCOUNTER — Other Ambulatory Visit: Payer: Self-pay

## 2019-10-03 ENCOUNTER — Ambulatory Visit: Payer: Medicare Other

## 2019-10-03 ENCOUNTER — Telehealth: Payer: Self-pay

## 2019-10-03 VITALS — BP 134/72 | HR 74 | Temp 98.1°F | Ht 65.4 in | Wt 222.0 lb

## 2019-10-03 DIAGNOSIS — I129 Hypertensive chronic kidney disease with stage 1 through stage 4 chronic kidney disease, or unspecified chronic kidney disease: Secondary | ICD-10-CM

## 2019-10-03 NOTE — Progress Notes (Signed)
Pt is here for blood pressure check. She is currently on olmesartan-hctz 40-25. Today her blood pressure is 134/72. At her last visit it was 124/82. Pt states that she is no longer having headaches that they stopped when she stopped the gout medication. Pt will follow up with provider

## 2019-10-03 NOTE — Telephone Encounter (Signed)
-----   Message from Dorothyann Peng, MD sent at 10/02/2019 11:28 PM EDT ----- Is she better since stopping the colchicine? What is her bp running?  ----- Message ----- From: Mariam Dollar, CMA Sent: 09/25/2019   1:45 PM EDT To: Dorothyann Peng, MD  Ms Helzer said that her foot is better, that she thinks the colchicine  is running her blood pressure up because her bp was 187/84 she said he checked it before she left and that it had come down some.  The pt can't remember the reading and that she has bee having headaches since she started the gout med.  The pt said she is ready for her new cholesterol med to be sent to the pharmacy

## 2019-10-31 ENCOUNTER — Telehealth: Payer: Self-pay

## 2019-10-31 NOTE — Telephone Encounter (Signed)
I called the pt to prechart for her visit tomorrow and the pt said that she has to reschedule, she's in quarantine because her granddaughter tested positive for covid.  The pt was told to call the office and notify Dr. Allyne Gee if she test positive.

## 2019-11-01 ENCOUNTER — Ambulatory Visit: Payer: Medicare Other | Admitting: Internal Medicine

## 2019-11-03 DIAGNOSIS — Z20822 Contact with and (suspected) exposure to covid-19: Secondary | ICD-10-CM | POA: Diagnosis not present

## 2019-12-11 ENCOUNTER — Ambulatory Visit: Payer: Medicare Other | Admitting: Internal Medicine

## 2019-12-15 ENCOUNTER — Other Ambulatory Visit: Payer: Self-pay | Admitting: Internal Medicine

## 2019-12-15 DIAGNOSIS — E785 Hyperlipidemia, unspecified: Secondary | ICD-10-CM

## 2019-12-19 ENCOUNTER — Ambulatory Visit: Payer: Medicare Other | Admitting: Internal Medicine

## 2019-12-25 DIAGNOSIS — E785 Hyperlipidemia, unspecified: Secondary | ICD-10-CM | POA: Diagnosis not present

## 2019-12-25 DIAGNOSIS — I10 Essential (primary) hypertension: Secondary | ICD-10-CM | POA: Diagnosis not present

## 2019-12-25 DIAGNOSIS — N189 Chronic kidney disease, unspecified: Secondary | ICD-10-CM | POA: Diagnosis not present

## 2019-12-25 DIAGNOSIS — M1990 Unspecified osteoarthritis, unspecified site: Secondary | ICD-10-CM | POA: Diagnosis not present

## 2020-01-08 ENCOUNTER — Encounter: Payer: Self-pay | Admitting: Nurse Practitioner

## 2020-01-08 ENCOUNTER — Other Ambulatory Visit: Payer: Self-pay

## 2020-01-08 ENCOUNTER — Ambulatory Visit (INDEPENDENT_AMBULATORY_CARE_PROVIDER_SITE_OTHER): Payer: Medicare Other | Admitting: Nurse Practitioner

## 2020-01-08 VITALS — BP 120/78 | HR 89 | Temp 98.3°F | Ht 65.4 in | Wt 220.0 lb

## 2020-01-08 DIAGNOSIS — Z6836 Body mass index (BMI) 36.0-36.9, adult: Secondary | ICD-10-CM

## 2020-01-08 DIAGNOSIS — E7800 Pure hypercholesterolemia, unspecified: Secondary | ICD-10-CM

## 2020-01-08 DIAGNOSIS — R7303 Prediabetes: Secondary | ICD-10-CM

## 2020-01-08 DIAGNOSIS — Z23 Encounter for immunization: Secondary | ICD-10-CM

## 2020-01-08 DIAGNOSIS — N183 Chronic kidney disease, stage 3 unspecified: Secondary | ICD-10-CM

## 2020-01-08 DIAGNOSIS — Z1231 Encounter for screening mammogram for malignant neoplasm of breast: Secondary | ICD-10-CM

## 2020-01-08 DIAGNOSIS — I10 Essential (primary) hypertension: Secondary | ICD-10-CM | POA: Diagnosis not present

## 2020-01-08 NOTE — Patient Instructions (Addendum)
Hypertension, Adult Hypertension is another name for high blood pressure. High blood pressure forces your heart to work harder to pump blood. This can cause problems over time. There are two numbers in a blood pressure reading. There is a top number (systolic) over a bottom number (diastolic). It is best to have a blood pressure that is below 120/80. Healthy choices can help lower your blood pressure, or you may need medicine to help lower it. What are the causes? The cause of this condition is not known. Some conditions may be related to high blood pressure. What increases the risk?  Smoking.  Having type 2 diabetes mellitus, high cholesterol, or both.  Not getting enough exercise or physical activity.  Being overweight.  Having too much fat, sugar, calories, or salt (sodium) in your diet.  Drinking too much alcohol.  Having long-term (chronic) kidney disease.  Having a family history of high blood pressure.  Age. Risk increases with age.  Race. You may be at higher risk if you are African American.  Gender. Men are at higher risk than women before age 45. After age 65, women are at higher risk than men.  Having obstructive sleep apnea.  Stress. What are the signs or symptoms?  High blood pressure may not cause symptoms. Very high blood pressure (hypertensive crisis) may cause: ? Headache. ? Feelings of worry or nervousness (anxiety). ? Shortness of breath. ? Nosebleed. ? A feeling of being sick to your stomach (nausea). ? Throwing up (vomiting). ? Changes in how you see. ? Very bad chest pain. ? Seizures. How is this treated?  This condition is treated by making healthy lifestyle changes, such as: ? Eating healthy foods. ? Exercising more. ? Drinking less alcohol.  Your health care provider may prescribe medicine if lifestyle changes are not enough to get your blood pressure under control, and if: ? Your top number is above 130. ? Your bottom number is above  80.  Your personal target blood pressure may vary. Follow these instructions at home: Eating and drinking   If told, follow the DASH eating plan. To follow this plan: ? Fill one half of your plate at each meal with fruits and vegetables. ? Fill one fourth of your plate at each meal with whole grains. Whole grains include whole-wheat pasta, brown rice, and whole-grain bread. ? Eat or drink low-fat dairy products, such as skim milk or low-fat yogurt. ? Fill one fourth of your plate at each meal with low-fat (lean) proteins. Low-fat proteins include fish, chicken without skin, eggs, beans, and tofu. ? Avoid fatty meat, cured and processed meat, or chicken with skin. ? Avoid pre-made or processed food.  Eat less than 1,500 mg of salt each day.  Do not drink alcohol if: ? Your doctor tells you not to drink. ? You are pregnant, may be pregnant, or are planning to become pregnant.  If you drink alcohol: ? Limit how much you use to:  0-1 drink a day for women.  0-2 drinks a day for men. ? Be aware of how much alcohol is in your drink. In the [place] one drink equals one 12 oz bottle of beer (355 mL), one 5 oz glass of wine (148 mL), or one 1 oz glass of hard liquor (44 mL). Lifestyle   Work with your doctor to stay at a healthy weight or to lose weight. Ask your doctor what the best weight is for you.  Get at least 30 minutes of exercise most   days of the week. This may include walking, swimming, or biking.  Get at least 30 minutes of exercise that strengthens your muscles (resistance exercise) at least 3 days a week. This may include lifting weights or doing Pilates.  Do not use any products that contain nicotine or tobacco, such as cigarettes, e-cigarettes, and chewing tobacco. If you need help quitting, ask your doctor.  Check your blood pressure at home as told by your doctor.  Keep all follow-up visits as told by your doctor. This is important. Medicines  Take over-the-counter  and prescription medicines only as told by your doctor. Follow directions carefully.  Do not skip doses of blood pressure medicine. The medicine does not work as well if you skip doses. Skipping doses also puts you at risk for problems.  Ask your doctor about side effects or reactions to medicines that you should watch for. Contact a doctor if you:  Think you are having a reaction to the medicine you are taking.  Have headaches that keep coming back (recurring).  Feel dizzy.  Have swelling in your ankles.  Have trouble with your vision. Get help right away if you:  Get a very bad headache.  Start to feel mixed up (confused).  Feel weak or numb.  Feel faint.  Have very bad pain in your: ? Chest. ? Belly (abdomen).  Throw up more than once.  Have trouble breathing. Summary  Hypertension is another name for high blood pressure.  High blood pressure forces your heart to work harder to pump blood.  For most people, a normal blood pressure is less than 120/80.  Making healthy choices can help lower blood pressure. If your blood pressure does not get lower with healthy choices, you may need to take medicine. This information is not intended to replace advice given to you by your health care provider. Make sure you discuss any questions you have with your health care provider. Document Revised: 11/10/2017 Document Reviewed: 11/10/2017 Elsevier Patient Education  2020 Elsevier Inc.   Influenza Virus Vaccine (Flucelvax) What is this medicine? INFLUENZA VIRUS VACCINE (in floo EN zuh VAHY ruhs vak SEEN) helps to reduce the risk of getting influenza also known as the flu. The vaccine only helps protect you against some strains of the flu. This medicine may be used for other purposes; ask your health care provider or pharmacist if you have questions. COMMON BRAND NAME(S): FLUCELVAX What should I tell my health care provider before I take this medicine? They need to know if you  have any of these conditions:  bleeding disorder like hemophilia  fever or infection  Guillain-Barre syndrome or other neurological problems  immune system problems  infection with the human immunodeficiency virus (HIV) or AIDS  low blood platelet counts  multiple sclerosis  an unusual or allergic reaction to influenza virus vaccine, other medicines, foods, dyes or preservatives  pregnant or trying to get pregnant  breast-feeding How should I use this medicine? This vaccine is for injection into a muscle. It is given by a health care professional. A copy of Vaccine Information Statements will be given before each vaccination. Read this sheet carefully each time. The sheet may change frequently. Talk to your pediatrician regarding the use of this medicine in children. Special care may be needed. Overdosage: If you think you've taken too much of this medicine contact a poison control center or emergency room at once. Overdosage: If you think you have taken too much of this medicine contact a poison   control center or emergency room at once. NOTE: This medicine is only for you. Do not share this medicine with others. What if I miss a dose? This does not apply. What may interact with this medicine?  chemotherapy or radiation therapy  medicines that lower your immune system like etanercept, anakinra, infliximab, and adalimumab  medicines that treat or prevent blood clots like warfarin  phenytoin  steroid medicines like prednisone or cortisone  theophylline  vaccines This list may not describe all possible interactions. Give your health care provider a list of all the medicines, herbs, non-prescription drugs, or dietary supplements you use. Also tell them if you smoke, drink alcohol, or use illegal drugs. Some items may interact with your medicine. What should I watch for while using this medicine? Report any side effects that do not go away within 3 days to your doctor or  health care professional. Call your health care provider if any unusual symptoms occur within 6 weeks of receiving this vaccine. You may still catch the flu, but the illness is not usually as bad. You cannot get the flu from the vaccine. The vaccine will not protect against colds or other illnesses that may cause fever. The vaccine is needed every year. What side effects may I notice from receiving this medicine? Side effects that you should report to your doctor or health care professional as soon as possible:  allergic reactions like skin rash, itching or hives, swelling of the face, lips, or tongue Side effects that usually do not require medical attention (Report these to your doctor or health care professional if they continue or are bothersome.):  fever  headache  muscle aches and pains  pain, tenderness, redness, or swelling at the injection site  tiredness This list may not describe all possible side effects. Call your doctor for medical advice about side effects. You may report side effects to FDA at 1-800-FDA-1088. Where should I keep my medicine? The vaccine will be given by a health care professional in a clinic, pharmacy, doctor's office, or other health care setting. You will not be given vaccine doses to store at home. NOTE: This sheet is a summary. It may not cover all possible information. If you have questions about this medicine, talk to your doctor, pharmacist, or health care provider.  2020 Elsevier/Gold Standard (2011-02-11 14:06:47)  

## 2020-01-08 NOTE — Progress Notes (Signed)
Rutherford Nail as a scribe for Minette Brine, FNP.,have documented all relevant documentation on the behalf of Minette Brine, FNP,as directed by  Minette Brine, FNP while in the presence of Minette Brine, [place]. This visit occurred during the SARS-CoV-2 public health emergency.  Safety protocols were in place, including screening questions prior to the visit, additional usage of staff PPE, and extensive cleaning of exam room while observing appropriate contact time as indicated for disinfecting solutions.  Subjective:     Patient ID: Tara Guerra , female    DOB: Jul 19, 1945 , 74 y.o.   MRN: [id number]   Chief Complaint  Patient presents with  . Hypertension    HPI  Hypertension This is a chronic problem. The current episode started more than 1 year ago. The problem has been gradually improving since onset. The problem is controlled. Pertinent negatives include no blurred vision, chest pain, headaches, palpitations or shortness of breath. There are no associated agents to hypertension. Risk factors for coronary artery disease include obesity and sedentary lifestyle. Past treatments include diuretics and angiotensin blockers. The current treatment provides moderate improvement. Hypertensive end-organ damage includes kidney disease. Identifiable causes of hypertension include chronic renal disease (stage 3 ).     Past Medical History:  Diagnosis Date  . Arthritis    hips andhands , Knees  . Chronic kidney disease   . Hypercholesteremia   . Hypertension      Family History  Problem Relation Age of Onset  . Alzheimer's disease Mother   . Heart attack Father      Current Outpatient Medications:  Marland Kitchen  Menthol, Topical Analgesic, (BENGAY EX), Apply 1 application topically daily as needed (pain)., Disp: , Rfl:  .  olmesartan-hydrochlorothiazide (BENICAR HCT) 40-25 MG tablet, TAKE 1 TABLET BY MOUTH EVERY DAY, Disp: 90 tablet, Rfl: 1 .  rosuvastatin (CRESTOR) 10 MG tablet, TAKE 1  TABLET(10 MG) BY MOUTH AT BEDTIME, Disp: 90 tablet, Rfl: 1   No Known Allergies   Review of Systems  Constitutional: Negative.  Negative for fatigue.  HENT: Negative.   Eyes: Negative for blurred vision.  Respiratory: Negative.  Negative for shortness of breath.   Cardiovascular: Negative for chest pain, palpitations and leg swelling.  Endocrine: Negative for polydipsia, polyphagia and polyuria.  Musculoskeletal: Negative.   Skin: Negative.   Neurological: Negative for dizziness and headaches.  Psychiatric/Behavioral: Negative.      Today's Vitals   01/08/20 1415  BP: 120/78  Pulse: 89  Temp: 98.3 F (36.8 C)  TempSrc: Oral  Weight: 220 lb (99.8 kg)  Height: 5' 5.4" (1.661 m)  PainSc: 0-No pain   Body mass index is 36.16 kg/m.   Objective:  Physical Exam Vitals reviewed.  Constitutional:      General: She is not in acute distress.    Appearance: Normal appearance. She is well-developed. She is obese.  Eyes:     Pupils: Pupils are equal, round, and reactive to light.  Cardiovascular:     Rate and Rhythm: Normal rate and regular rhythm.     Pulses: Normal pulses.     Heart sounds: Normal heart sounds. No murmur heard.   Pulmonary:     Effort: Pulmonary effort is normal. No respiratory distress.     Breath sounds: Normal breath sounds. No wheezing.  Musculoskeletal:        General: No swelling or tenderness. Normal range of motion.     Right lower leg: No edema.     Left lower leg: No  edema.  Skin:    General: Skin is warm and dry.     Capillary Refill: Capillary refill takes less than 2 seconds.  Neurological:     General: No focal deficit present.     Mental Status: She is alert and oriented to person, place, and time.     Cranial Nerves: No cranial nerve deficit.     Motor: No weakness.  Psychiatric:        Mood and Affect: Mood normal.        Behavior: Behavior normal.        Thought Content: Thought content normal.        Judgment: Judgment normal.          Assessment And Plan:     1. Essential hypertension, benign . B/P is controlled.  . CMP ordered to check renal function.  . The importance of regular exercise and dietary modification was stressed to the patient.   2. Stage 3 chronic kidney disease, unspecified whether stage 3a or 3b CKD ([place])  She is encouraged to stay well hydrated with water   3. Pre-diabetes  Chronic, controlled  She is not taking any medications for this, diet and exercise controlled.    Encouraged to limit intake of sugary foods and drinks  Encouraged to increase physical activity to 150 minutes per week as tolerated - Hemoglobin A1c  4. Pure hypercholesterolemia  Chronic, tolerating rosuvastatin well  Continue to avoid high fat foods.   - Lipid panel - BMP8+eGFR - Liver Profile  5. Encounter for screening mammogram for breast cancer  Pt instructed on Self Breast Exam.According to ACOG guidelines Women aged 74 and older are recommended to get an annual mammogram. Referral sent to [facility] for appointment scheduing.   Pt encouraged to get annual mammogram - MM DIGITAL SCREENING BILATERAL; Future  6. Need for influenza vaccination  Influenza vaccine administered  Encouraged to take Tylenol as needed for fever or muscle aches.  I have advised her to wait 2 weeks before getting her covid booster from her flu vaccine - Flu Vaccine QUAD High Dose(Fluad)  7. Class 2 severe obesity due to excess calories with serious comorbidity and body mass index (BMI) of 36.0 to 36.9 in adult [facility])  She is encouraged to increase her physical activity as tolerated.  She has had a slow weight decline since July 2021   Wt Readings from Last 3 Encounters:  01/08/20 220 lb (99.8 kg)  10/03/19 222 lb (100.7 kg)  09/19/19 226 lb 3.2 oz (102.6 kg)     Patient was given opportunity to ask questions. Patient verbalized understanding of the plan and was able to repeat key elements of the plan.  All questions were answered to their satisfaction.   Teola Bradley, FNP, have reviewed all documentation for this visit. The documentation on 01/08/20 for the exam, diagnosis, procedures, and orders are all accurate and complete.  THE PATIENT IS ENCOURAGED TO PRACTICE SOCIAL DISTANCING DUE TO THE COVID-19 PANDEMIC.

## 2020-01-09 LAB — BMP8+EGFR
BUN/Creatinine Ratio: 14 (ref 12–28)
BUN: 17 mg/dL (ref 8–27)
CO2: 23 mmol/L (ref 20–29)
Calcium: 8.9 mg/dL (ref 8.7–10.3)
Chloride: 102 mmol/L (ref 96–106)
Creatinine, Ser: 1.23 mg/dL — ABNORMAL HIGH (ref 0.57–1.00)
GFR calc Af Amer: 50 mL/min/{1.73_m2} — ABNORMAL LOW (ref >59)
GFR calc non Af Amer: 43 mL/min/{1.73_m2} — ABNORMAL LOW (ref >59)
Glucose: 98 mg/dL (ref 65–99)
Potassium: 4.1 mmol/L (ref 3.5–5.2)
Sodium: 138 mmol/L (ref 134–144)

## 2020-01-09 LAB — HEPATIC FUNCTION PANEL
ALT: 8 IU/L (ref 0–32)
AST: 11 IU/L (ref 0–40)
Albumin: 4.3 g/dL (ref 3.7–4.7)
Alkaline Phosphatase: 80 IU/L (ref 44–121)
Bilirubin Total: 0.4 mg/dL (ref 0.0–1.2)
Bilirubin, Direct: 0.13 mg/dL (ref 0.00–0.40)
Total Protein: 7.1 g/dL (ref 6.0–8.5)

## 2020-01-09 LAB — LIPID PANEL
Chol/HDL Ratio: 2.4 ratio (ref 0.0–4.4)
Cholesterol, Total: 156 mg/dL (ref 100–199)
HDL: 64 mg/dL (ref >39)
LDL Chol Calc (NIH): 79 mg/dL (ref 0–99)
Triglycerides: 67 mg/dL (ref 0–149)
VLDL Cholesterol Cal: 13 mg/dL (ref 5–40)

## 2020-01-09 LAB — HEMOGLOBIN A1C
Est. average glucose Bld gHb Est-mCnc: 126 mg/dL
Hgb A1c MFr Bld: 6 % — ABNORMAL HIGH (ref 4.8–5.6)

## 2020-01-16 ENCOUNTER — Other Ambulatory Visit: Payer: Self-pay | Admitting: Internal Medicine

## 2020-01-16 DIAGNOSIS — E785 Hyperlipidemia, unspecified: Secondary | ICD-10-CM

## 2020-02-19 ENCOUNTER — Encounter: Payer: Medicare Other | Admitting: Internal Medicine

## 2020-04-18 DIAGNOSIS — H35363 Drusen (degenerative) of macula, bilateral: Secondary | ICD-10-CM | POA: Diagnosis not present

## 2020-04-18 DIAGNOSIS — H401131 Primary open-angle glaucoma, bilateral, mild stage: Secondary | ICD-10-CM | POA: Diagnosis not present

## 2020-04-18 DIAGNOSIS — H2513 Age-related nuclear cataract, bilateral: Secondary | ICD-10-CM | POA: Diagnosis not present

## 2020-04-18 DIAGNOSIS — H524 Presbyopia: Secondary | ICD-10-CM | POA: Diagnosis not present

## 2020-05-03 DIAGNOSIS — M109 Gout, unspecified: Secondary | ICD-10-CM | POA: Diagnosis not present

## 2020-05-03 DIAGNOSIS — E785 Hyperlipidemia, unspecified: Secondary | ICD-10-CM | POA: Diagnosis not present

## 2020-05-03 DIAGNOSIS — I10 Essential (primary) hypertension: Secondary | ICD-10-CM | POA: Diagnosis not present

## 2020-05-03 DIAGNOSIS — M1990 Unspecified osteoarthritis, unspecified site: Secondary | ICD-10-CM | POA: Diagnosis not present

## 2020-05-15 ENCOUNTER — Encounter: Payer: Self-pay | Admitting: Nurse Practitioner

## 2020-05-15 ENCOUNTER — Other Ambulatory Visit: Payer: Self-pay

## 2020-05-15 ENCOUNTER — Ambulatory Visit (INDEPENDENT_AMBULATORY_CARE_PROVIDER_SITE_OTHER): Payer: Medicare Other | Admitting: Nurse Practitioner

## 2020-05-15 VITALS — BP 126/70 | HR 77 | Temp 98.3°F | Ht 65.8 in | Wt 221.6 lb

## 2020-05-15 DIAGNOSIS — M109 Gout, unspecified: Secondary | ICD-10-CM

## 2020-05-15 DIAGNOSIS — E7800 Pure hypercholesterolemia, unspecified: Secondary | ICD-10-CM | POA: Diagnosis not present

## 2020-05-15 DIAGNOSIS — R2241 Localized swelling, mass and lump, right lower limb: Secondary | ICD-10-CM | POA: Diagnosis not present

## 2020-05-15 DIAGNOSIS — R7303 Prediabetes: Secondary | ICD-10-CM | POA: Diagnosis not present

## 2020-05-15 DIAGNOSIS — I10 Essential (primary) hypertension: Secondary | ICD-10-CM

## 2020-05-15 DIAGNOSIS — Z6835 Body mass index (BMI) 35.0-35.9, adult: Secondary | ICD-10-CM

## 2020-05-15 MED ORDER — PREDNISONE 10 MG (21) PO TBPK: ORAL_TABLET | ORAL | 0 refills | Status: DC

## 2020-05-15 MED ORDER — TRIAMCINOLONE ACETONIDE 40 MG/ML IJ SUSP
40.0000 mg | Freq: Once | INTRAMUSCULAR | Status: AC
  Administered 2020-05-15: 40 mg via INTRAMUSCULAR

## 2020-05-15 NOTE — Progress Notes (Signed)
I,Tianna Badgett,acting as a Education administrator for [employer], NP.,have documented all relevant documentation on the behalf of [employer], NP,as directed by  Bary Castilla, NP while in the presence of Bary Castilla, NP.  This visit occurred during the SARS-CoV-2 public health emergency.  Safety protocols were in place, including screening questions prior to the visit, additional usage of staff PPE, and extensive cleaning of exam room while observing appropriate contact time as indicated for disinfecting solutions.  Subjective:     Patient ID: Tara Guerra , female    DOB: July 03, 1945 , 75 y.o.   MRN: [id number]   Chief Complaint  Patient presents with  . Hypertension    HPI  Patient is here for hypertension. She is compliant with medications. She also complaints of has a complaint of righ foot pain. She has had this in the past. Her foot is swollen. She states she has been treated for Gout in the past however did not take the colchicine as prescribed because it caused her BP to raise.   Hypertension This is a chronic problem. The current episode started more than 1 year ago. The problem has been gradually improving since onset. The problem is controlled. Pertinent negatives include no blurred vision, chest pain, headaches, palpitations or shortness of breath. There are no associated agents to hypertension. Risk factors for coronary artery disease include obesity and sedentary lifestyle. Past treatments include diuretics and angiotensin blockers. The current treatment provides moderate improvement. Hypertensive end-organ damage includes kidney disease. Identifiable causes of hypertension include chronic renal disease (stage 3 ).     Past Medical History:  Diagnosis Date  . Arthritis    hips andhands , Knees  . Chronic kidney disease   . Hypercholesteremia   . Hypertension      Family History  Problem Relation Age of Onset  . Alzheimer's disease Mother   . Heart attack  Father      Current Outpatient Medications:  Marland Kitchen  Menthol, Topical Analgesic, (BENGAY EX), Apply 1 application topically daily as needed (pain)., Disp: , Rfl:  .  olmesartan-hydrochlorothiazide (BENICAR HCT) 40-25 MG tablet, TAKE 1 TABLET BY MOUTH EVERY DAY, Disp: 90 tablet, Rfl: 1 .  predniSONE (STERAPRED UNI-PAK 21 TAB) 10 MG (21) TBPK tablet, Take as directed., Disp: 21 tablet, Rfl: 0 .  rosuvastatin (CRESTOR) 10 MG tablet, TAKE 1 TABLET(10 MG) BY MOUTH AT BEDTIME, Disp: 90 tablet, Rfl: 1   No Known Allergies   Review of Systems  Constitutional: Negative.  Negative for chills and fever.  Eyes: Negative for blurred vision.  Respiratory: Negative.  Negative for cough and shortness of breath.   Cardiovascular: Negative.  Negative for chest pain and palpitations.  Gastrointestinal: Negative.   Musculoskeletal: Positive for arthralgias and joint swelling.  Neurological: Negative.  Negative for headaches.     Today's Vitals   05/15/20 0944  BP: 126/70  Pulse: 77  Temp: 98.3 F (36.8 C)  TempSrc: Oral  Weight: 221 lb 9.6 oz (100.5 kg)  Height: 5' 5.8" (1.671 m)   Body mass index is 35.98 kg/m.  Wt Readings from Last 3 Encounters:  05/15/20 221 lb 9.6 oz (100.5 kg)  01/08/20 220 lb (99.8 kg)  10/03/19 222 lb (100.7 kg)    Objective:  Physical Exam Constitutional:      Appearance: Normal appearance. She is obese.  HENT:     Head: Normocephalic and atraumatic.  Cardiovascular:     Rate and Rhythm: Normal rate and regular rhythm.  Pulses: Normal pulses.     Heart sounds: Normal heart sounds. No murmur heard.   Pulmonary:     Effort: Pulmonary effort is normal. No respiratory distress.     Breath sounds: No wheezing.  Musculoskeletal:        General: Swelling and tenderness present.     Right foot: Swelling present.     Comments: Swelling/warmth and tenderness to right foot. No ankle swelling   Neurological:     Mental Status: She is alert.         Assessment  And Plan:     1. Essential hypertension, benign -Stable, chronic, continue med  - BMP8+EGFR  2. Localized swelling of right foot - Uric acid - triamcinolone acetonide (KENALOG-40) injection 40 mg - predniSONE (STERAPRED UNI-PAK 21 TAB) 10 MG (21) TBPK tablet; Take as directed.  Dispense: 21 tablet; Refill: 0  3. Podagra - Uric acid -Will consider adding allopurinol   4. Pre-diabetes - Hemoglobin A1c  5. Pure hypercholesterolemia - Stable, Continue med   -Lipid panel  6. Class 2 severe obesity due to excess calories with serious comorbidity and body mass index (BMI) of 35.0 to 35.9 in adult [place]) -Educated patient about maintaining a healthy diet that consist of eating fruits and vegetables. Drink a lot of water and avoid sugary drinks and foods. Exercise as tolerated for atleast 150 min. Per week.       Wt Readings from Last 3 Encounters:  05/15/20 221 lb 9.6 oz (100.5 kg)  01/08/20 220 lb (99.8 kg)  10/03/19 222 lb (100.7 kg)      Patient was given opportunity to ask questions. Patient verbalized understanding of the plan and was able to repeat key elements of the plan. All questions were answered to their satisfaction.  Bary Castilla, NP   I, Bary Castilla, NP, have reviewed all documentation for this visit. The documentation on 05/15/20 for the exam, diagnosis, procedures, and orders are all accurate and complete.  THE PATIENT IS ENCOURAGED TO PRACTICE SOCIAL DISTANCING DUE TO THE COVID-19 PANDEMIC.

## 2020-05-15 NOTE — Patient Instructions (Signed)

## 2020-05-16 LAB — BMP8+EGFR
BUN/Creatinine Ratio: 14 (ref 12–28)
BUN: 21 mg/dL (ref 8–27)
CO2: 19 mmol/L — ABNORMAL LOW (ref 20–29)
Calcium: 9.5 mg/dL (ref 8.7–10.3)
Chloride: 104 mmol/L (ref 96–106)
Creatinine, Ser: 1.51 mg/dL — ABNORMAL HIGH (ref 0.57–1.00)
Glucose: 86 mg/dL (ref 65–99)
Potassium: 4.2 mmol/L (ref 3.5–5.2)
Sodium: 141 mmol/L (ref 134–144)
eGFR: 36 mL/min/{1.73_m2} — ABNORMAL LOW (ref >59)

## 2020-05-16 LAB — LIPID PANEL
Chol/HDL Ratio: 2.5 ratio (ref 0.0–4.4)
Cholesterol, Total: 161 mg/dL (ref 100–199)
HDL: 65 mg/dL (ref >39)
LDL Chol Calc (NIH): 82 mg/dL (ref 0–99)
Triglycerides: 72 mg/dL (ref 0–149)
VLDL Cholesterol Cal: 14 mg/dL (ref 5–40)

## 2020-05-16 LAB — HEMOGLOBIN A1C
Est. average glucose Bld gHb Est-mCnc: 123 mg/dL
Hgb A1c MFr Bld: 5.9 % — ABNORMAL HIGH (ref 4.8–5.6)

## 2020-05-16 LAB — URIC ACID: Uric Acid: 7 mg/dL (ref 3.1–7.9)

## 2020-05-27 ENCOUNTER — Telehealth: Payer: Self-pay

## 2020-05-27 NOTE — Telephone Encounter (Signed)
The pt updated her contact numbers and she was given her most recent lab results.

## 2020-05-29 ENCOUNTER — Ambulatory Visit: Payer: Medicare Other | Admitting: Internal Medicine

## 2020-05-29 ENCOUNTER — Ambulatory Visit (INDEPENDENT_AMBULATORY_CARE_PROVIDER_SITE_OTHER): Payer: Medicare Other

## 2020-05-29 VITALS — Ht 65.0 in | Wt 216.0 lb

## 2020-05-29 DIAGNOSIS — Z0000 Encounter for general adult medical examination without abnormal findings: Secondary | ICD-10-CM | POA: Diagnosis not present

## 2020-05-29 NOTE — Patient Instructions (Signed)
Tara Guerra , Thank you for taking time to come for your Medicare Wellness Visit. I appreciate your ongoing commitment to your health goals. Please review the following plan we discussed and let me know if I can assist you in the future.   Screening recommendations/referrals: Colonoscopy: completed 01/05/2018 Mammogram: patient to schedule Bone Density: completed 03/26/2016 Recommended yearly ophthalmology/optometry visit for glaucoma screening and checkup Recommended yearly dental visit for hygiene and checkup  Vaccinations: Influenza vaccine: completed 01/08/2020, due 10/14/2020 Pneumococcal vaccine: due Tdap vaccine: completed 07/15/2011, due 07/14/2021 Shingles vaccine: discussed   Covid-19: 01/27/2020, 06/08/2019, 05/11/2019  Advanced directives: Advance directive discussed with you today.   Conditions/risks identified: none  Next appointment: Follow up in one year for your annual wellness visit    Preventive Care 65 Years and Older, Female Preventive care refers to lifestyle choices and visits with your health care provider that can promote health and wellness. What does preventive care include?  A yearly physical exam. This is also called an annual well check.  Dental exams once or twice a year.  Routine eye exams. Ask your health care provider how often you should have your eyes checked.  Personal lifestyle choices, including:  Daily care of your teeth and gums.  Regular physical activity.  Eating a healthy diet.  Avoiding tobacco and drug use.  Limiting alcohol use.  Practicing safe sex.  Taking low-dose aspirin every day.  Taking vitamin and mineral supplements as recommended by your health care provider. What happens during an annual well check? The services and screenings done by your health care provider during your annual well check will depend on your age, overall health, lifestyle risk factors, and family history of disease. Counseling  Your health care  provider may ask you questions about your:  Alcohol use.  Tobacco use.  Drug use.  Emotional well-being.  Home and relationship well-being.  Sexual activity.  Eating habits.  History of falls.  Memory and ability to understand (cognition).  Work and work Astronomer.  Reproductive health. Screening  You may have the following tests or measurements:  Height, weight, and BMI.  Blood pressure.  Lipid and cholesterol levels. These may be checked every 5 years, or more frequently if you are over 30 years old.  Skin check.  Lung cancer screening. You may have this screening every year starting at age 33 if you have a 30-pack-year history of smoking and currently smoke or have quit within the past 15 years.  Fecal occult blood test (FOBT) of the stool. You may have this test every year starting at age 32.  Flexible sigmoidoscopy or colonoscopy. You may have a sigmoidoscopy every 5 years or a colonoscopy every 10 years starting at age 25.  Hepatitis C blood test.  Hepatitis B blood test.  Sexually transmitted disease (STD) testing.  Diabetes screening. This is done by checking your blood sugar (glucose) after you have not eaten for a while (fasting). You may have this done every 1-3 years.  Bone density scan. This is done to screen for osteoporosis. You may have this done starting at age 27.  Mammogram. This may be done every 1-2 years. Talk to your health care provider about how often you should have regular mammograms. Talk with your health care provider about your test results, treatment options, and if necessary, the need for more tests. Vaccines  Your health care provider may recommend certain vaccines, such as:  Influenza vaccine. This is recommended every year.  Tetanus, diphtheria, and acellular  pertussis (Tdap, Td) vaccine. You may need a Td booster every 10 years.  Zoster vaccine. You may need this after age 51.  Pneumococcal 13-valent conjugate (PCV13)  vaccine. One dose is recommended after age 75.  Pneumococcal polysaccharide (PPSV23) vaccine. One dose is recommended after age 27. Talk to your health care provider about which screenings and vaccines you need and how often you need them. This information is not intended to replace advice given to you by your health care provider. Make sure you discuss any questions you have with your health care provider. Document Released: 03/29/2015 Document Revised: 11/20/2015 Document Reviewed: 01/01/2015 Elsevier Interactive Patient Education  2017 [employer] Prevention in the Home Falls can cause injuries. They can happen to people of all ages. There are many things you can do to make your home safe and to help prevent falls. What can I do on the outside of my home?  Regularly fix the edges of walkways and driveways and fix any cracks.  Remove anything that might make you trip as you walk through a door, such as a raised step or threshold.  Trim any bushes or trees on the path to your home.  Use bright outdoor lighting.  Clear any walking paths of anything that might make someone trip, such as rocks or tools.  Regularly check to see if handrails are loose or broken. Make sure that both sides of any steps have handrails.  Any raised decks and porches should have guardrails on the edges.  Have any leaves, snow, or ice cleared regularly.  Use sand or salt on walking paths during winter.  Clean up any spills in your garage right away. This includes oil or grease spills. What can I do in the bathroom?  Use night lights.  Install grab bars by the toilet and in the tub and shower. Do not use towel bars as grab bars.  Use non-skid mats or decals in the tub or shower.  If you need to sit down in the shower, use a plastic, non-slip stool.  Keep the floor dry. Clean up any water that spills on the floor as soon as it happens.  Remove soap buildup in the tub or shower  regularly.  Attach bath mats securely with double-sided non-slip rug tape.  Do not have throw rugs and other things on the floor that can make you trip. What can I do in the bedroom?  Use night lights.  Make sure that you have a light by your bed that is easy to reach.  Do not use any sheets or blankets that are too big for your bed. They should not hang down onto the floor.  Have a firm chair that has side arms. You can use this for support while you get dressed.  Do not have throw rugs and other things on the floor that can make you trip. What can I do in the kitchen?  Clean up any spills right away.  Avoid walking on wet floors.  Keep items that you use a lot in easy-to-reach places.  If you need to reach something above you, use a strong step stool that has a grab bar.  Keep electrical cords out of the way.  Do not use floor polish or wax that makes floors slippery. If you must use wax, use non-skid floor wax.  Do not have throw rugs and other things on the floor that can make you trip. What can I do with my stairs?  Do not leave any items on the stairs.  Make sure that there are handrails on both sides of the stairs and use them. Fix handrails that are broken or loose. Make sure that handrails are as long as the stairways.  Check any carpeting to make sure that it is firmly attached to the stairs. Fix any carpet that is loose or worn.  Avoid having throw rugs at the top or bottom of the stairs. If you do have throw rugs, attach them to the floor with carpet tape.  Make sure that you have a light switch at the top of the stairs and the bottom of the stairs. If you do not have them, ask someone to add them for you. What else can I do to help prevent falls?  Wear shoes that:  Do not have high heels.  Have rubber bottoms.  Are comfortable and fit you well.  Are closed at the toe. Do not wear sandals.  If you use a stepladder:  Make sure that it is fully  opened. Do not climb a closed stepladder.  Make sure that both sides of the stepladder are locked into place.  Ask someone to hold it for you, if possible.  Clearly mark and make sure that you can see:  Any grab bars or handrails.  First and last steps.  Where the edge of each step is.  Use tools that help you move around (mobility aids) if they are needed. These include:  Canes.  Walkers.  Scooters.  Crutches.  Turn on the lights when you go into a dark area. Replace any light bulbs as soon as they burn out.  Set up your furniture so you have a clear path. Avoid moving your furniture around.  If any of your floors are uneven, fix them.  If there are any pets around you, be aware of where they are.  Review your medicines with your doctor. Some medicines can make you feel dizzy. This can increase your chance of falling. Ask your doctor what other things that you can do to help prevent falls. This information is not intended to replace advice given to you by your health care provider. Make sure you discuss any questions you have with your health care provider. Document Released: 12/27/2008 Document Revised: 08/08/2015 Document Reviewed: 04/06/2014 Elsevier Interactive Patient Education  2017 [employer].

## 2020-05-29 NOTE — Progress Notes (Signed)
I connected with Tara Guerra today by telephone and verified that I am speaking with the correct person using two identifiers. Location patient: home Location provider: work Persons participating in the virtual visit: Tara Guerra, Tara Guerra.   I discussed the limitations, risks, security and privacy concerns of performing an evaluation and management service by telephone and the availability of in person appointments. I also discussed with the patient that there may be a patient responsible charge related to this service. The patient expressed understanding and verbally consented to this telephonic visit.    Interactive audio and video telecommunications were attempted between this provider and patient, however failed, due to patient having technical difficulties OR patient did not have access to video capability.  We continued and completed visit with audio only.     Vital signs may be patient reported or missing.  Subjective:   Tara Guerra is a 75 y.o. female who presents for Medicare Annual (Subsequent) preventive examination.  Review of Systems     Cardiac Risk Factors include: advanced age (>85men, >47 women);dyslipidemia;hypertension;obesity (BMI >30kg/m2);sedentary lifestyle     Objective:    Today's Vitals   05/29/20 1432  Weight: 216 lb (98 kg)  Height: 5\' 5"  (1.651 m)   Body mass index is 35.94 kg/m.  Advanced Directives 05/29/2020 05/24/2019 09/29/2018 09/26/2018 03/30/2018 11/18/2014  Does Patient Have a Medical Advance Directive? No No No No Yes No  Does patient want to make changes to medical advance directive? - - - - No - Patient declined -  Would patient like information on creating a medical advance directive? - Yes (MAU/Ambulatory/Procedural Areas - Information given) No - Patient declined No - Patient declined - Yes - 01/18/2015 given;Yes - Spiritual care consult ordered    Current Medications (verified) Outpatient Encounter Medications  as of 05/29/2020  Medication Sig  . Menthol, Topical Analgesic, (BENGAY EX) Apply 1 application topically daily as needed (pain).  05/31/2020 olmesartan-hydrochlorothiazide (BENICAR HCT) 40-25 MG tablet TAKE 1 TABLET BY MOUTH EVERY DAY  . rosuvastatin (CRESTOR) 10 MG tablet TAKE 1 TABLET(10 MG) BY MOUTH AT BEDTIME  . predniSONE (STERAPRED UNI-PAK 21 TAB) 10 MG (21) TBPK tablet Take as directed. (Patient not taking: Reported on 05/29/2020)   No facility-administered encounter medications on file as of 05/29/2020.    Allergies (verified) Patient has no known allergies.   History: Past Medical History:  Diagnosis Date  . Arthritis    hips andhands , Knees  . Chronic kidney disease   . Hypercholesteremia   . Hypertension    Past Surgical History:  Procedure Laterality Date  . COLONOSCOPY  2019  . TOTAL HIP ARTHROPLASTY Right 09/29/2018   Procedure: TOTAL HIP ARTHROPLASTY ANTERIOR APPROACH;  Surgeon: 10/01/2018, MD;  Location: [facility];  Service: Orthopedics;  Laterality: Right;  Tara Guerra VASCULAR SURGERY Right    Varicose veins stripping   Family History  Problem Relation Age of Onset  . Alzheimer's disease Mother   . Heart attack Father    Social History   Socioeconomic History  . Marital status: Widowed    Spouse name: Not on file  . Number of children: Not on file  . Years of education: Not on file  . Highest education level: Not on file  Occupational History  . Not on file  Tobacco Use  . Smoking status: Never Smoker  . Smokeless tobacco: Never Used  Vaping Use  . Vaping Use: Never used  Substance and Sexual Activity  . Alcohol use:  No  . Drug use: No  . Sexual activity: Not Currently  Other Topics Concern  . Not on file  Social History Narrative  . Not on file   Social Determinants of Health   Financial Resource Strain: Low Risk   . Difficulty of Paying Living Expenses: Not hard at all  Food Insecurity: No Food Insecurity  . Worried About Programme researcher, broadcasting/film/video in the  Last Year: Never true  . Ran Out of Food in the Last Year: Never true  Transportation Needs: No Transportation Needs  . Lack of Transportation (Medical): No  . Lack of Transportation (Non-Medical): No  Physical Activity: Inactive  . Days of Exercise per Week: 0 days  . Minutes of Exercise per Session: 0 min  Stress: No Stress Concern Present  . Feeling of Stress : Not at all  Social Connections: Not on file    Tobacco Counseling Counseling given: Not Answered   Clinical Intake:  Pre-visit preparation completed: Yes  Pain : No/denies pain     Nutritional Status: BMI > 30  Obese Nutritional Risks: None Diabetes: No  How often do you need to have someone help you when you read instructions, pamphlets, or other written materials from your doctor or pharmacy?: 1 - Never What is the last grade level you completed in school?: 11th grade  Diabetic? no  Interpreter Needed?: No  Information entered by :: NAllen Guerra   Activities of Daily Living In your present state of health, do you have any difficulty performing the following activities: 05/29/2020  Hearing? N  Vision? N  Difficulty concentrating or making decisions? N  Walking or climbing stairs? N  Dressing or bathing? N  Doing errands, shopping? N  Preparing Food and eating ? N  Using the Toilet? N  In the past six months, have you accidently leaked urine? N  Do you have problems with loss of bowel control? N  Managing your Medications? N  Managing your Finances? N  Housekeeping or managing your Housekeeping? N  Some recent data might be hidden    Patient Care Team: Tara Peng, MD as PCP - General (Internal Medicine)  Indicate any recent Medical Services you may have received from other than [facility] providers in the past year (date may be approximate).     Assessment:   This is a routine wellness examination for Tara Guerra.  Hearing/Vision screen No exam data present  Dietary issues and exercise activities  discussed: Current Exercise Habits: The patient does not participate in regular exercise at present  Goals    .  DIET - INCREASE WATER INTAKE (pt-stated)      Eat healthy    .  Patient Stated      05/29/2020, wants to weigh 178    .  Weight (lb) < 200 lb (90.7 kg)      05/24/2019, wants to get to 178 pounds      Depression Screen PHQ 2/9 Scores 05/29/2020 05/24/2019 12/29/2018 09/19/2018 08/17/2018 05/11/2018 03/30/2018  PHQ - 2 Score 0 0 0 0 0 1 1  PHQ- 9 Score - 0 - - - 4 2    Fall Risk Fall Risk  05/29/2020 05/24/2019 12/29/2018 09/19/2018 08/17/2018  Falls in the past year? 0 0 0 0 0  Risk for fall due to : Medication side effect Medication side effect - - -  Follow up Falls evaluation completed;Education provided;Falls prevention discussed Falls evaluation completed;Education provided;Falls prevention discussed - - -    FALL RISK PREVENTION  PERTAINING TO THE HOME:  Any stairs in or around the home? Yes  If so, are there any without handrails? No  Home free of loose throw rugs in walkways, pet beds, electrical cords, etc? Yes  Adequate lighting in your home to reduce risk of falls? Yes   ASSISTIVE DEVICES UTILIZED TO PREVENT FALLS:  Life alert? No  Use of a cane, Uriarte or w/c? No  Grab bars in the bathroom? Yes  Shower chair or bench in shower? Yes  Elevated toilet seat or a handicapped toilet? Yes   TIMED UP AND GO:  Was the test performed? No   Cognitive Function:     6CIT Screen 05/29/2020 05/24/2019 03/30/2018  What Year? 0 points 0 points 0 points  What month? 0 points 0 points 0 points  What time? 0 points 3 points 0 points  Count back from 20 4 points 2 points 0 points  Months in reverse 4 points 4 points 2 points  Repeat phrase 4 points 2 points 4 points  Total Score 12 11 6     Immunizations Immunization History  Administered Date(s) Administered  . Fluad Quad(high Dose 65+) 01/08/2020  . Influenza Whole 12/15/2007, 01/24/2009, 01/09/2010  . Influenza, High  Dose Seasonal PF 11/11/2018  . Influenza,inj,Quad PF,6+ Mos 02/03/2015  . Influenza-Unspecified 12/14/2017  . PFIZER(Purple Top)SARS-COV-2 Vaccination 05/11/2019, 06/08/2019  . Td 12/15/2007    TDAP status: Up to date  Flu Vaccine status: Up to date  Pneumococcal vaccine status: Due, Education has been provided regarding the importance of this vaccine. Advised may receive this vaccine at local pharmacy or Health Dept. Aware to provide a copy of the vaccination record if obtained from local pharmacy or Health Dept. Verbalized acceptance and understanding.  Covid-19 vaccine status: Completed vaccines  Qualifies for Shingles Vaccine? Yes   Zostavax completed No   Shingrix Completed?: No.    Education has been provided regarding the importance of this vaccine. Patient has been advised to call insurance company to determine out of pocket expense if they have not yet received this vaccine. Advised may also receive vaccine at local pharmacy or Health Dept. Verbalized acceptance and understanding.  Screening Tests Health Maintenance  Topic Date Due  . Hepatitis C Screening  Never done  . PNA vac Low Risk Adult (2 of 2 - PPSV23) 08/22/2014  . COVID-19 Vaccine (3 - Booster for Pfizer series) 12/09/2019  . MAMMOGRAM  01/06/2020  . TETANUS/TDAP  07/14/2021  . COLONOSCOPY (Pts [id number] Insurance coverage will need to be confirmed)  08/22/2026  . INFLUENZA VACCINE  Completed  . DEXA SCAN  Completed  . HPV VACCINES  Aged Out    Health Maintenance  Health Maintenance Due  Topic Date Due  . Hepatitis C Screening  Never done  . PNA vac Low Risk Adult (2 of 2 - PPSV23) 08/22/2014  . COVID-19 Vaccine (3 - Booster for Pfizer series) 12/09/2019  . MAMMOGRAM  01/06/2020    Colorectal cancer screening: Type of screening: Colonoscopy. Completed 08/21/2016. Repeat every 10 years  Mammogram status: patient to schedule  Bone Density status: Completed 03/26/2016.  Lung Cancer Screening: (Low Dose CT  Chest recommended if Age 62-80 years, 30 pack-year currently smoking OR have quit w/in 15years.) does not qualify.   Lung Cancer Screening Referral: no  Additional Screening:  Hepatitis C Screening: does qualify; due  Vision Screening: Recommended annual ophthalmology exams for early detection of glaucoma and other disorders of the eye. Is the patient up to date with  their annual eye exam?  Yes  Who is the provider or what is the name of the office in which the patient attends annual eye exams? Dr. Aundria Rud If pt is not established with a provider, would they like to be referred to a provider to establish care? No .   Dental Screening: Recommended annual dental exams for proper oral hygiene  Community Resource Referral / Chronic Care Management: CRR required this visit?  No   CCM required this visit?  No      Plan:     I have personally reviewed and noted the following in the patient's chart:   . Medical and social history . Use of alcohol, tobacco or illicit drugs  . Current medications and supplements . Functional ability and status . Nutritional status . Physical activity . Advanced directives . List of other physicians . Hospitalizations, surgeries, and ER visits in previous 12 months . Vitals . Screenings to include cognitive, depression, and falls . Referrals and appointments  In addition, I have reviewed and discussed with patient certain preventive protocols, quality metrics, and best practice recommendations. A written personalized care plan for preventive services as well as general preventive health recommendations were provided to patient.     Barb Merino, Guerra   08/30/735   Nurse Notes:

## 2020-06-12 ENCOUNTER — Ambulatory Visit: Payer: Medicare Other | Admitting: Nurse Practitioner

## 2020-07-20 ENCOUNTER — Other Ambulatory Visit: Payer: Self-pay | Admitting: Internal Medicine

## 2020-07-20 DIAGNOSIS — E785 Hyperlipidemia, unspecified: Secondary | ICD-10-CM

## 2020-09-02 ENCOUNTER — Other Ambulatory Visit: Payer: Self-pay | Admitting: Nurse Practitioner

## 2020-09-02 ENCOUNTER — Ambulatory Visit
Admission: RE | Admit: 2020-09-02 | Discharge: 2020-09-02 | Disposition: A | Discharge status: Home / Self Care | Payer: Medicare Other | Source: Ambulatory Visit | Attending: Nurse Practitioner | Admitting: Nurse Practitioner

## 2020-09-02 ENCOUNTER — Other Ambulatory Visit: Payer: Self-pay

## 2020-09-02 DIAGNOSIS — Z1231 Encounter for screening mammogram for malignant neoplasm of breast: Secondary | ICD-10-CM

## 2020-10-19 IMAGING — US US RENAL
1 series · 14 of 25 positions shown · non-contrast
Comparison: None.

CLINICAL DATA: Chronic kidney disease

EXAM:
RENAL / URINARY TRACT ULTRASOUND COMPLETE

[Series 1: us renal · 0.19mm/px · 14 of 36 slices shown]
[im 1/36]
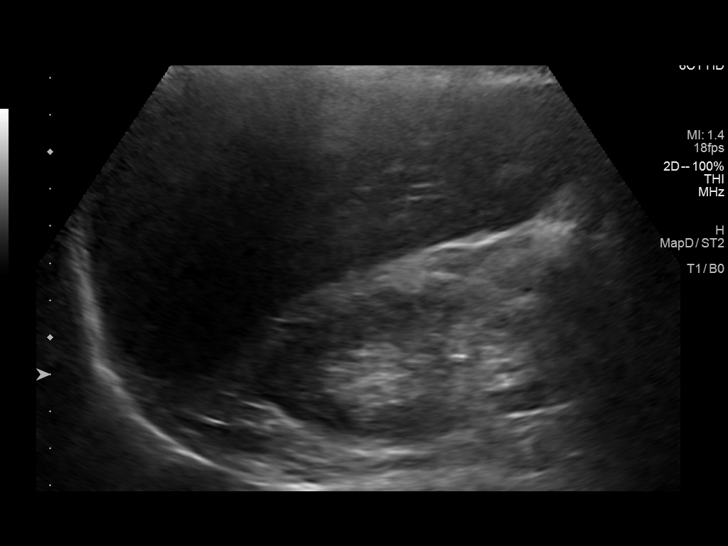
[im 3/36]
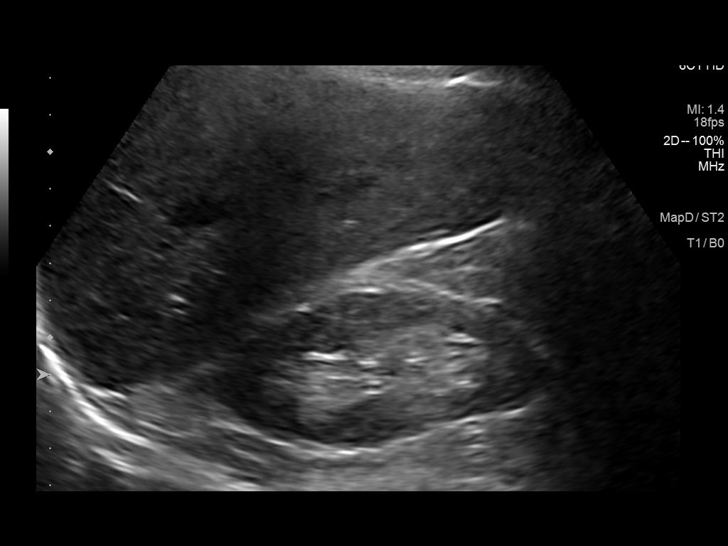
[im 6/36]
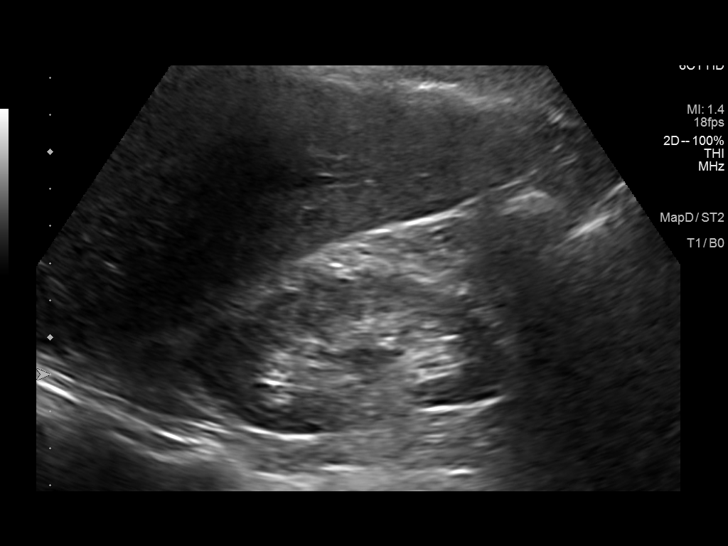
[im 9/36]
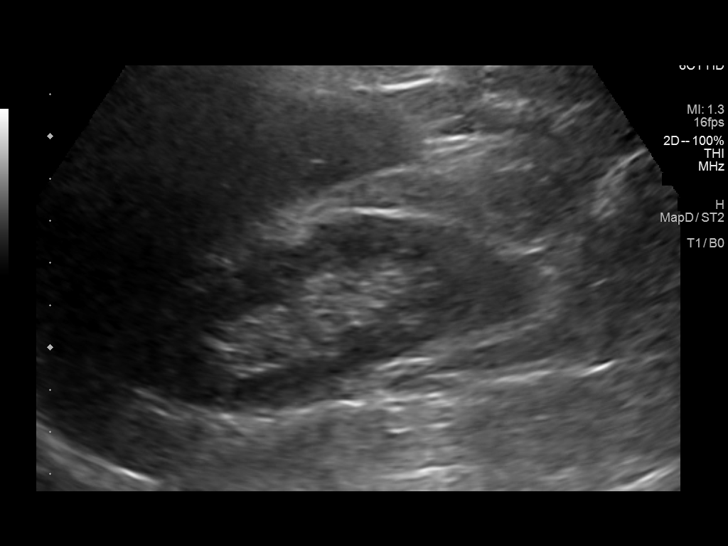
[im 12/36]
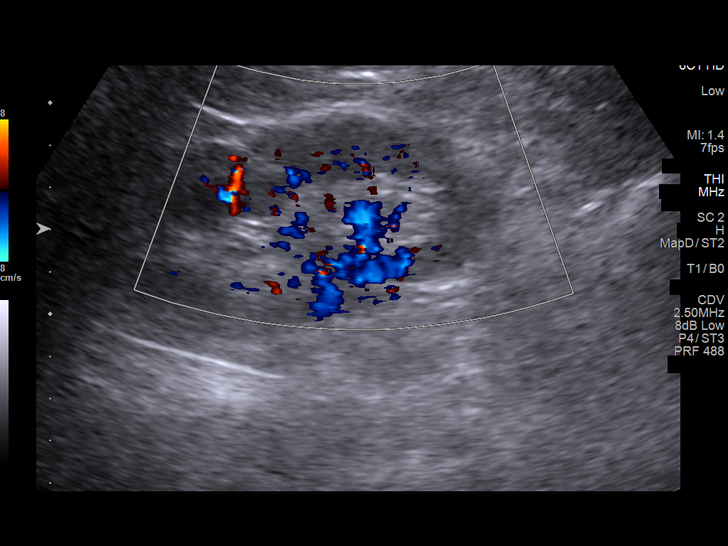
[im 14/36]
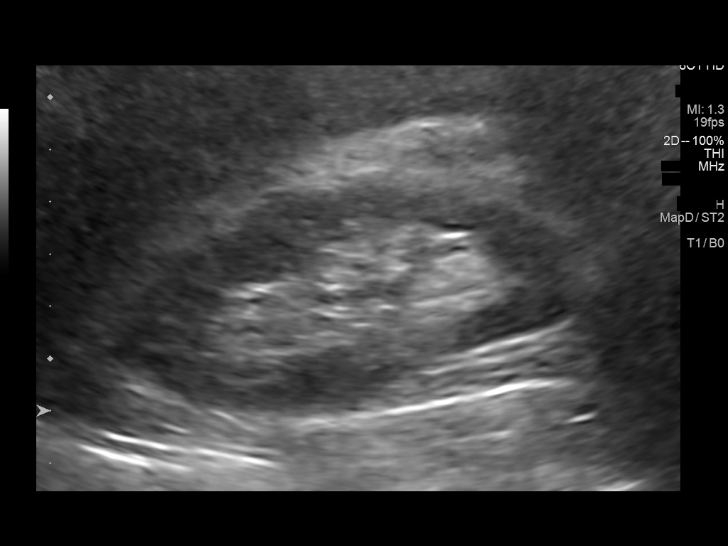
[im 17/36]
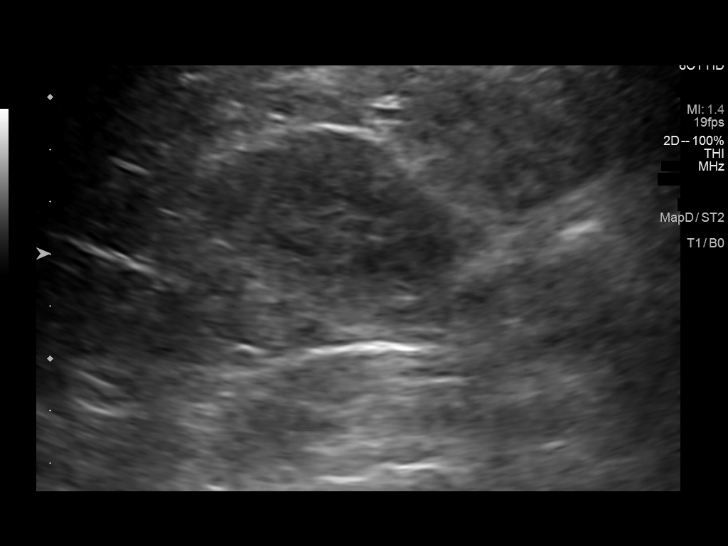
[im 19/36]
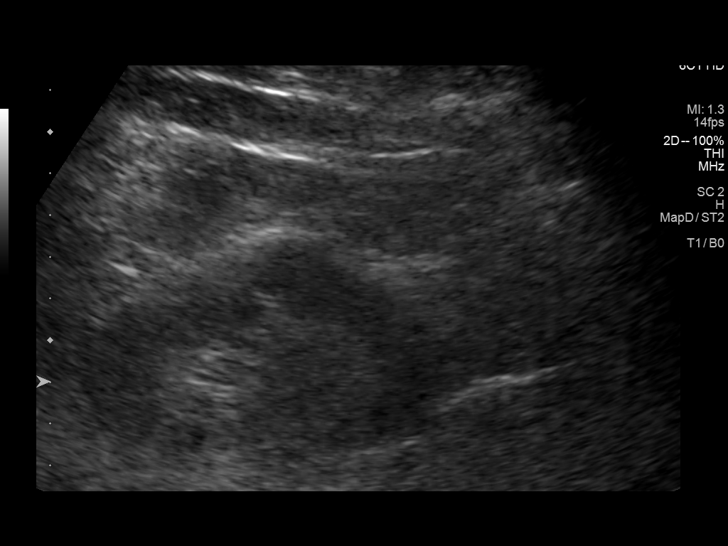
[im 22/36]
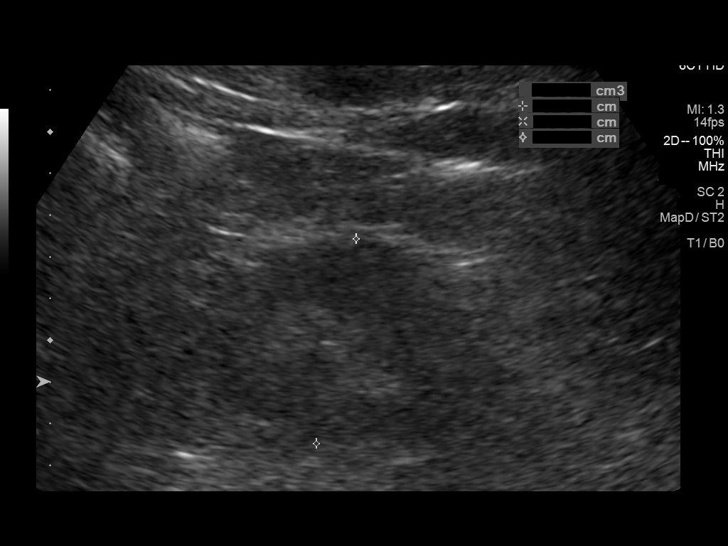
[im 24/36]
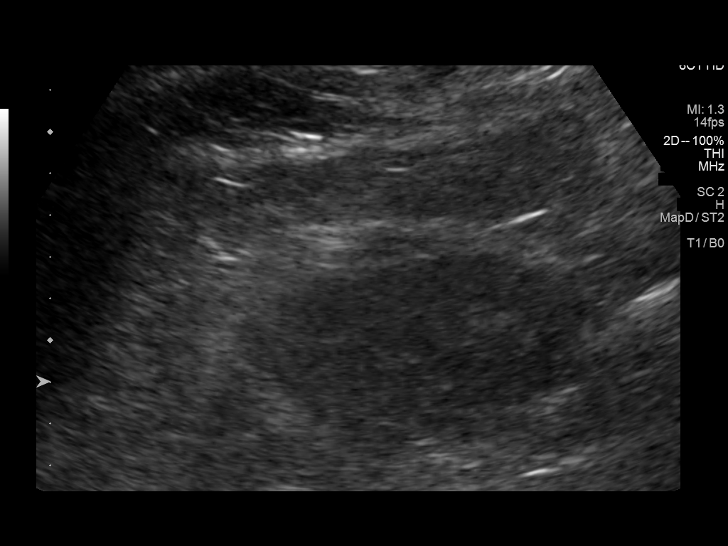
[im 27/36]
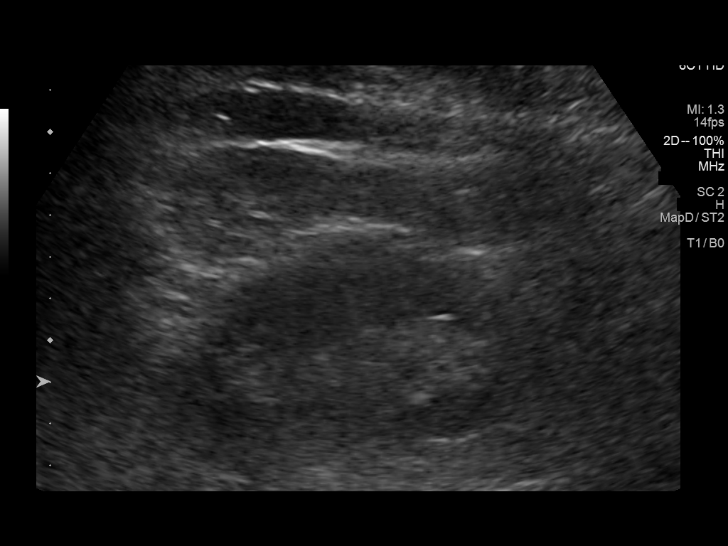
[im 30/36]
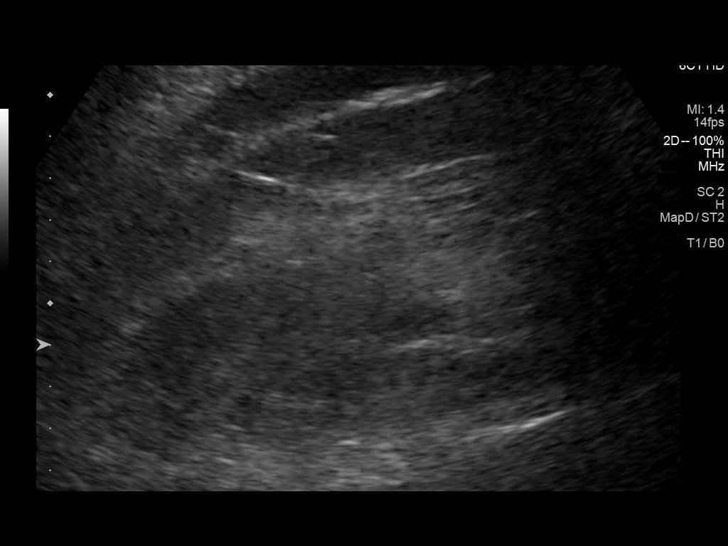
[im 33/36]
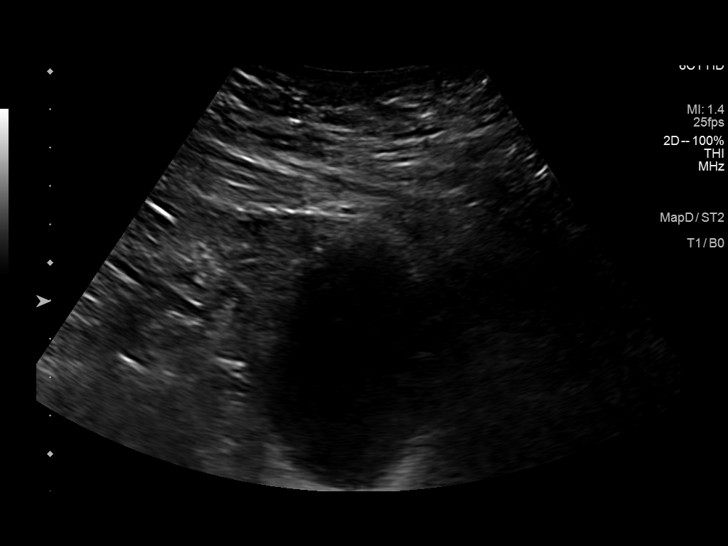
[im 36/36]
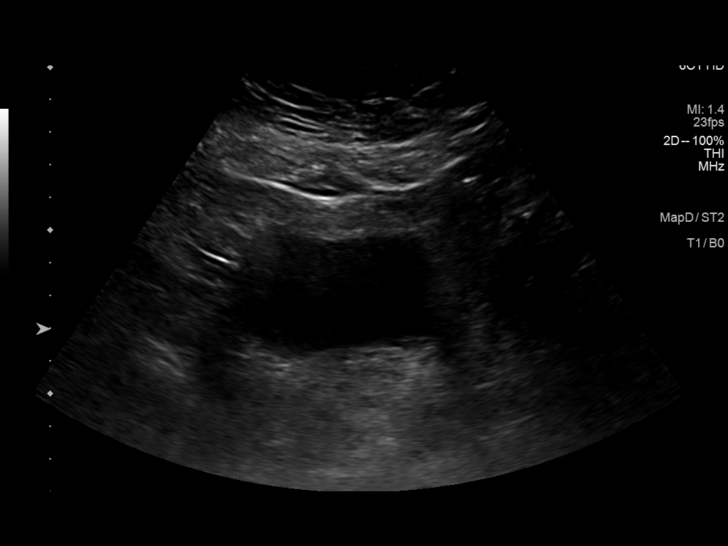

[14 of 25 positions shown; findings below may reference images not displayed]

FINDINGS: Right Kidney:

Renal measurements: 9.4 by 4.3 x 4.7 cm = volume: 99 mL .
Echogenicity within normal limits. No mass or hydronephrosis
visualized.

Left Kidney:

Renal measurements: 9.5 x 5.4 x 5.0 cm = volume: 136 mL.
Echogenicity within normal limits. No mass or hydronephrosis
visualized.

Bladder:

Appears normal for degree of bladder distention.
IMPRESSION: No acute findings.  No hydronephrosis.

## 2020-12-25 ENCOUNTER — Encounter: Payer: Self-pay | Admitting: Nurse Practitioner

## 2020-12-25 ENCOUNTER — Ambulatory Visit (INDEPENDENT_AMBULATORY_CARE_PROVIDER_SITE_OTHER): Payer: Medicare Other | Admitting: Nurse Practitioner

## 2020-12-25 ENCOUNTER — Other Ambulatory Visit: Payer: Self-pay

## 2020-12-25 VITALS — BP 130/72 | HR 64 | Temp 98.2°F | Ht 65.8 in | Wt 212.0 lb

## 2020-12-25 DIAGNOSIS — I10 Essential (primary) hypertension: Secondary | ICD-10-CM

## 2020-12-25 DIAGNOSIS — E6609 Other obesity due to excess calories: Secondary | ICD-10-CM

## 2020-12-25 DIAGNOSIS — E7800 Pure hypercholesterolemia, unspecified: Secondary | ICD-10-CM | POA: Diagnosis not present

## 2020-12-25 DIAGNOSIS — E559 Vitamin D deficiency, unspecified: Secondary | ICD-10-CM

## 2020-12-25 DIAGNOSIS — K219 Gastro-esophageal reflux disease without esophagitis: Secondary | ICD-10-CM | POA: Diagnosis not present

## 2020-12-25 DIAGNOSIS — Z6834 Body mass index (BMI) 34.0-34.9, adult: Secondary | ICD-10-CM

## 2020-12-25 DIAGNOSIS — Z23 Encounter for immunization: Secondary | ICD-10-CM

## 2020-12-25 DIAGNOSIS — R7303 Prediabetes: Secondary | ICD-10-CM | POA: Diagnosis not present

## 2020-12-25 MED ORDER — OMEPRAZOLE MAGNESIUM 20 MG PO TBEC: 20.0000 mg | DELAYED_RELEASE_TABLET | Freq: Every day | ORAL | 2 refills | Status: DC

## 2020-12-25 NOTE — Patient Instructions (Signed)

## 2020-12-25 NOTE — Progress Notes (Signed)
I,Tianna Badgett,acting as a Education administrator for [employer], NP.,have documented all relevant documentation on the behalf of [employer], NP,as directed by  Bary Castilla, NP while in the presence of Bary Castilla, NP.  This visit occurred during the SARS-CoV-2 public health emergency.  Safety protocols were in place, including screening questions prior to the visit, additional usage of staff PPE, and extensive cleaning of exam room while observing appropriate contact time as indicated for disinfecting solutions.  Subjective:     Patient ID: Tara Guerra , female    DOB: [birth year] , 75 y.o.   MRN: [id number]  Chief Complaint  Patient presents with   Hypertension   HPI  Patient is here for hypertension. She is compliant with medications. She is eating better.  She is walking a little bit. She has no other concerns. Did not complain of any flare ups.   Hypertension This is a chronic problem. The current episode started more than 1 year ago. The problem has been gradually improving since onset. The problem is controlled. Pertinent negatives include no blurred vision, chest pain, headaches, palpitations or shortness of breath. There are no associated agents to hypertension. Risk factors for coronary artery disease include obesity and sedentary lifestyle. Past treatments include diuretics and angiotensin blockers. The current treatment provides moderate improvement. Hypertensive end-organ damage includes kidney disease. Identifiable causes of hypertension include chronic renal disease (stage 3 ).    Past Medical History:  Diagnosis Date   Arthritis    hips andhands , Knees   Chronic kidney disease    Hypercholesteremia    Hypertension      Family History  Problem Relation Age of Onset   Alzheimer's disease Mother    Heart attack Father      Current Outpatient Medications:    omeprazole (PRILOSEC OTC) 20 MG tablet, Take 1 tablet (20 mg total) by mouth daily., Disp: 30  tablet, Rfl: 2   Menthol, Topical Analgesic, (BENGAY EX), Apply 1 application topically daily as needed (pain)., Disp: , Rfl:    olmesartan-hydrochlorothiazide (BENICAR HCT) 40-25 MG tablet, TAKE 1 TABLET BY MOUTH EVERY DAY, Disp: 90 tablet, Rfl: 1   rosuvastatin (CRESTOR) 10 MG tablet, TAKE 1 TABLET(10 MG) BY MOUTH AT BEDTIME, Disp: 90 tablet, Rfl: 1   No Known Allergies   Review of Systems  Constitutional: Negative.  Negative for chills, fatigue and fever.  HENT:  Negative for congestion, sinus pressure, sinus pain and sore throat.   Eyes:  Negative for blurred vision.  Respiratory: Negative.  Negative for cough, shortness of breath and wheezing.   Cardiovascular: Negative.  Negative for chest pain and palpitations.  Gastrointestinal: Negative.   Endocrine: Negative for polydipsia and polyphagia.  Neurological: Negative.  Negative for weakness and headaches.    Today's Vitals   12/25/20 1041  BP: 130/72  Pulse: 64  Temp: 98.2 F (36.8 C)  TempSrc: Oral  Weight: 212 lb (96.2 kg)  Height: 5' 5.8" (1.671 m)   Body mass index is 34.43 kg/m.  Wt Readings from Last 3 Encounters:  12/25/20 212 lb (96.2 kg)  05/29/20 216 lb (98 kg)  05/15/20 221 lb 9.6 oz (100.5 kg)    Objective:  Physical Exam Constitutional:      Appearance: Normal appearance. She is obese.  HENT:     Head: Normocephalic and atraumatic.  Cardiovascular:     Rate and Rhythm: Normal rate and regular rhythm.     Pulses: Normal pulses.     Heart sounds: Normal  heart sounds. No murmur heard. Pulmonary:     Effort: Pulmonary effort is normal. No respiratory distress.     Breath sounds: Normal breath sounds. No wheezing.  Skin:    General: Skin is warm and dry.     Capillary Refill: Capillary refill takes less than 2 seconds.  Neurological:     Mental Status: She is alert and oriented to person, place, and time.        Assessment And Plan:     1. Essential hypertension, benign -Chronic, stable,  continue meds  -She takes Olmastartan-hydrochlorothiazide without any SE  - CMP14+EGFR - CBC no Diff  2. Pre-diabetes -Currently not on any meds. She is focusing on lifestyle modification.  --Discussed with patient the importance of glycemic control and long term complications from uncontrolled diabetes. Discussed with the patient the importance of compliance with home glucose monitoring, diet which includes decrease amount of sugary drinks and foods. Importance of exercise was also discussed with the patient. Importance of eye exams, self foot care and compliance to office visits was also discussed with the patient.  - Hemoglobin A1c - CMP14+EGFR - CBC no Diff  3. Pure hypercholesterolemia -Chronic, continue meds  -She is on rosuvastatin 10 mg daily without any complaints.   4. Gastroesophageal reflux disease without esophagitis - omeprazole (PRILOSEC OTC) 20 MG tablet; Take 1 tablet (20 mg total) by mouth daily.  Dispense: 30 tablet; Refill: 2  5. Vitamin D deficiency -Will check and supplement if needed. Advised patient to spend atleast 15 min. Daily in sunlight.  - Vitamin D (25 hydroxy)  6. Need for influenza vaccination - Flu Vaccine QUAD High Dose(Fluad)  7. Class 1 obesity due to excess calories with serious comorbidity and body mass index (BMI) of 34.0 to 34.9 in adult Wt Readings from Last 3 Encounters:  12/25/20 212 lb (96.2 kg)  05/29/20 216 lb (98 kg)  05/15/20 221 lb 9.6 oz (100.5 kg)    Advised patient on a healthy diet including avoiding fast food and red meats. Increase the intake of lean meats including grilled chicken and [place].  Drink a lot of water. Decrease intake of fatty foods. Exercise for 30-45 min. 4-5 a week to decrease the risk of cardiac event.   The patient was encouraged to call or send a message through [place] for any questions or concerns.   Follow up: if symptoms persist or do not get better.   Side effects and appropriate use of all the  medication(s) were discussed with the patient today. Patient advised to use the medication(s) as directed by their healthcare provider. The patient was encouraged to read, review, and understand all associated package inserts and contact our office with any questions or concerns. The patient accepts the risks of the treatment plan and had an opportunity to ask questions.   Patient was given opportunity to ask questions. Patient verbalized understanding of the plan and was able to repeat key elements of the plan. All questions were answered to their satisfaction.  Raman Lindy Garczynski, DNP   I, Raman Darivs Lunden have reviewed all documentation for this visit. The documentation on 12/25/20 for the exam, diagnosis, procedures, and orders are all accurate and complete.    IF YOU HAVE BEEN REFERRED TO A SPECIALIST, IT MAY TAKE 1-2 WEEKS TO SCHEDULE/PROCESS THE REFERRAL. IF YOU HAVE NOT HEARD FROM US/SPECIALIST IN TWO WEEKS, PLEASE GIVE [place] A CALL AT [phone].   THE PATIENT IS ENCOURAGED TO PRACTICE SOCIAL DISTANCING DUE TO THE  COVID-19 PANDEMIC.

## 2020-12-26 LAB — CMP14+EGFR
ALT: 11 IU/L (ref 0–32)
AST: 17 IU/L (ref 0–40)
Albumin/Globulin Ratio: 1.3 (ref 1.2–2.2)
Albumin: 4.1 g/dL (ref 3.7–4.7)
Alkaline Phosphatase: 76 IU/L (ref 44–121)
BUN/Creatinine Ratio: 14 (ref 12–28)
BUN: 20 mg/dL (ref 8–27)
Bilirubin Total: 0.5 mg/dL (ref 0.0–1.2)
CO2: 23 mmol/L (ref 20–29)
Calcium: 9.3 mg/dL (ref 8.7–10.3)
Chloride: 103 mmol/L (ref 96–106)
Creatinine, Ser: 1.47 mg/dL — ABNORMAL HIGH (ref 0.57–1.00)
Globulin, Total: 3.1 g/dL (ref 1.5–4.5)
Glucose: 92 mg/dL (ref 70–99)
Potassium: 5 mmol/L (ref 3.5–5.2)
Sodium: 138 mmol/L (ref 134–144)
Total Protein: 7.2 g/dL (ref 6.0–8.5)
eGFR: 37 mL/min/{1.73_m2} — ABNORMAL LOW (ref >59)

## 2020-12-26 LAB — CBC
Hematocrit: 39.7 % (ref 34.0–46.6)
Hemoglobin: 12.7 g/dL (ref 11.1–15.9)
MCH: 26.9 pg (ref 26.6–33.0)
MCHC: 32 g/dL (ref 31.5–35.7)
MCV: 84 fL (ref 79–97)
Platelets: 246 10*3/uL (ref 150–450)
RBC: 4.72 x10E6/uL (ref 3.77–5.28)
RDW: 14.5 % (ref 11.7–15.4)
WBC: 5.4 10*3/uL (ref 3.4–10.8)

## 2020-12-26 LAB — HEMOGLOBIN A1C
Est. average glucose Bld gHb Est-mCnc: 128 mg/dL
Hgb A1c MFr Bld: 6.1 % — ABNORMAL HIGH (ref 4.8–5.6)

## 2020-12-26 LAB — VITAMIN D 25 HYDROXY (VIT D DEFICIENCY, FRACTURES): Vit D, 25-Hydroxy: 22.2 ng/mL — ABNORMAL LOW (ref 30.0–100.0)

## 2021-01-07 ENCOUNTER — Other Ambulatory Visit: Payer: Self-pay | Admitting: Nurse Practitioner

## 2021-01-07 DIAGNOSIS — E559 Vitamin D deficiency, unspecified: Secondary | ICD-10-CM

## 2021-01-07 MED ORDER — VITAMIN D (ERGOCALCIFEROL) 1.25 MG (50000 UNIT) PO CAPS: 50000.0000 [IU] | ORAL_CAPSULE | ORAL | 0 refills | Status: AC

## 2021-01-07 NOTE — Progress Notes (Signed)
Vit D sent

## 2021-03-06 ENCOUNTER — Other Ambulatory Visit: Payer: Self-pay | Admitting: Internal Medicine

## 2021-03-06 DIAGNOSIS — E785 Hyperlipidemia, unspecified: Secondary | ICD-10-CM

## 2021-03-07 ENCOUNTER — Other Ambulatory Visit: Payer: Self-pay | Admitting: Internal Medicine

## 2021-03-07 DIAGNOSIS — E785 Hyperlipidemia, unspecified: Secondary | ICD-10-CM

## 2021-03-19 ENCOUNTER — Other Ambulatory Visit: Payer: Self-pay | Admitting: Internal Medicine

## 2021-03-19 DIAGNOSIS — E785 Hyperlipidemia, unspecified: Secondary | ICD-10-CM

## 2021-03-24 ENCOUNTER — Other Ambulatory Visit: Payer: Self-pay

## 2021-03-24 DIAGNOSIS — E785 Hyperlipidemia, unspecified: Secondary | ICD-10-CM

## 2021-03-24 MED ORDER — OLMESARTAN MEDOXOMIL-HCTZ 40-25 MG PO TABS: 1.0000 | ORAL_TABLET | Freq: Every day | ORAL | 1 refills | Status: DC

## 2021-03-24 MED ORDER — ROSUVASTATIN CALCIUM 10 MG PO TABS: ORAL_TABLET | ORAL | 1 refills | Status: DC

## 2021-06-11 ENCOUNTER — Ambulatory Visit: Payer: Medicare Other

## 2021-06-11 ENCOUNTER — Ambulatory Visit: Payer: Medicare Other | Admitting: Internal Medicine

## 2021-06-11 NOTE — Progress Notes (Deleted)
?  This visit occurred during the SARS-CoV-2 public health emergency.  Safety protocols were in place, including screening questions prior to the visit, additional usage of staff PPE, and extensive cleaning of exam room while observing appropriate contact time as indicated for disinfecting solutions. ? ?Subjective:  ?  ? Patient ID: Tara Guerra , female    DOB: 03-20-1945 , 76 y.o.   MRN: [id number] ? ? ?No chief complaint on file. ? ? ?HPI ? ?HPI  ? ?Past Medical History:  ?Diagnosis Date  ? Arthritis   ? hips andhands , Knees  ? Chronic kidney disease   ? Hypercholesteremia   ? Hypertension   ?  ? ?Family History  ?Problem Relation Age of Onset  ? Alzheimer's disease Mother   ? Heart attack Father   ? ? ? ?Current Outpatient Medications:  ?  Menthol, Topical Analgesic, (BENGAY EX), Apply 1 application topically daily as needed (pain)., Disp: , Rfl:  ?  olmesartan-hydrochlorothiazide (BENICAR HCT) 40-25 MG tablet, Take 1 tablet by mouth daily., Disp: 90 tablet, Rfl: 1 ?  omeprazole (PRILOSEC OTC) 20 MG tablet, Take 1 tablet (20 mg total) by mouth daily., Disp: 30 tablet, Rfl: 2 ?  rosuvastatin (CRESTOR) 10 MG tablet, TAKE 1 TABLET(10 MG) BY MOUTH AT BEDTIME, Disp: 90 tablet, Rfl: 1 ?  Vitamin D, Ergocalciferol, (DRISDOL) 1.25 MG (50000 UNIT) CAPS capsule, Take 1 capsule (50,000 Units total) by mouth every 7 (seven) days., Disp: 12 capsule, Rfl: 0  ? ?No Known Allergies  ? ?Review of Systems  ? ?There were no vitals filed for this visit. ?There is no height or weight on file to calculate BMI.  ? ?Objective:  ?Physical Exam  ? ?   ?Assessment And Plan:  ?   ?There are no diagnoses linked to this encounter.  ? ? ?Patient was given opportunity to ask questions. Patient verbalized understanding of the plan and was able to repeat key elements of the plan. All questions were answered to their satisfaction.  ?Coolidge Breeze, CMA  ? ?I, Coolidge Breeze, CMA, have reviewed all documentation for this visit. The  documentation on 06/11/21 for the exam, diagnosis, procedures, and orders are all accurate and complete.  ? ?IF YOU HAVE BEEN REFERRED TO A SPECIALIST, IT MAY TAKE 1-2 WEEKS TO SCHEDULE/PROCESS THE REFERRAL. IF YOU HAVE NOT HEARD FROM US/SPECIALIST IN TWO WEEKS, PLEASE GIVE [place] A CALL AT [phone].  ? ?THE PATIENT IS ENCOURAGED TO PRACTICE SOCIAL DISTANCING DUE TO THE COVID-19 PANDEMIC.   ?

## 2021-06-19 ENCOUNTER — Other Ambulatory Visit: Payer: Self-pay

## 2021-06-19 ENCOUNTER — Ambulatory Visit (INDEPENDENT_AMBULATORY_CARE_PROVIDER_SITE_OTHER): Payer: Medicare (Managed Care)

## 2021-06-19 VITALS — Ht 65.0 in | Wt 207.0 lb

## 2021-06-19 DIAGNOSIS — Z0000 Encounter for general adult medical examination without abnormal findings: Secondary | ICD-10-CM | POA: Diagnosis not present

## 2021-06-19 DIAGNOSIS — E785 Hyperlipidemia, unspecified: Secondary | ICD-10-CM

## 2021-06-19 DIAGNOSIS — K219 Gastro-esophageal reflux disease without esophagitis: Secondary | ICD-10-CM

## 2021-06-19 MED ORDER — OMEPRAZOLE MAGNESIUM 20 MG PO TBEC: 20.0000 mg | DELAYED_RELEASE_TABLET | Freq: Every day | ORAL | 2 refills | Status: AC

## 2021-06-19 MED ORDER — ROSUVASTATIN CALCIUM 10 MG PO TABS: ORAL_TABLET | ORAL | 1 refills | Status: AC

## 2021-06-19 MED ORDER — OLMESARTAN MEDOXOMIL-HCTZ 40-25 MG PO TABS: 1.0000 | ORAL_TABLET | Freq: Every day | ORAL | 1 refills | Status: DC

## 2021-06-19 NOTE — Patient Instructions (Signed)
Tara Guerra , ?Thank you for taking time to come for your Medicare Wellness Visit. I appreciate your ongoing commitment to your health goals. Please review the following plan we discussed and let me know if I can assist you in the future.  ? ?Screening recommendations/referrals: ?Colonoscopy: completed 08/15/2016, due 08/16/2026 ?Mammogram: completed 09/03/2020, due 09/04/2021 ?Bone Density: completed 03/26/2016 ?Recommended yearly ophthalmology/optometry visit for glaucoma screening and checkup ?Recommended yearly dental visit for hygiene and checkup ? ?Vaccinations: ?Influenza vaccine: due next flu season ?Pneumococcal vaccine: due ?Tdap vaccine: completed 07/15/2011, due 07/14/2021 ?Shingles vaccine: discussed   ?Covid-19: 01/27/2020, 06/08/2019, 05/11/2019 ? ?Advanced directives: Advance directive discussed with you today.  ? ?Conditions/risks identified: none ? ?Next appointment: Follow up in one year for your annual wellness visit  ? ? ?Preventive Care 11 Years and Older, Female ?Preventive care refers to lifestyle choices and visits with your health care provider that can promote health and wellness. ?What does preventive care include? ?A yearly physical exam. This is also called an annual well check. ?Dental exams once or twice a year. ?Routine eye exams. Ask your health care provider how often you should have your eyes checked. ?Personal lifestyle choices, including: ?Daily care of your teeth and gums. ?Regular physical activity. ?Eating a healthy diet. ?Avoiding tobacco and drug use. ?Limiting alcohol use. ?Practicing safe sex. ?Taking low-dose aspirin every day. ?Taking vitamin and mineral supplements as recommended by your health care provider. ?What happens during an annual well check? ?The services and screenings done by your health care provider during your annual well check will depend on your age, overall health, lifestyle risk factors, and family history of disease. ?Counseling  ?Your health care provider may  ask you questions about your: ?Alcohol use. ?Tobacco use. ?Drug use. ?Emotional well-being. ?Home and relationship well-being. ?Sexual activity. ?Eating habits. ?History of falls. ?Memory and ability to understand (cognition). ?Work and work Astronomer. ?Reproductive health. ?Screening  ?You may have the following tests or measurements: ?Height, weight, and BMI. ?Blood pressure. ?Lipid and cholesterol levels. These may be checked every 5 years, or more frequently if you are over 16 years old. ?Skin check. ?Lung cancer screening. You may have this screening every year starting at age 55 if you have a 30-pack-year history of smoking and currently smoke or have quit within the past 15 years. ?Fecal occult blood test (FOBT) of the stool. You may have this test every year starting at age 56. ?Flexible sigmoidoscopy or colonoscopy. You may have a sigmoidoscopy every 5 years or a colonoscopy every 10 years starting at age 69. ?Hepatitis C blood test. ?Hepatitis B blood test. ?Sexually transmitted disease (STD) testing. ?Diabetes screening. This is done by checking your blood sugar (glucose) after you have not eaten for a while (fasting). You may have this done every 1-3 years. ?Bone density scan. This is done to screen for osteoporosis. You may have this done starting at age 40. ?Mammogram. This may be done every 1-2 years. Talk to your health care provider about how often you should have regular mammograms. ?Talk with your health care provider about your test results, treatment options, and if necessary, the need for more tests. ?Vaccines  ?Your health care provider may recommend certain vaccines, such as: ?Influenza vaccine. This is recommended every year. ?Tetanus, diphtheria, and acellular pertussis (Tdap, Td) vaccine. You may need a Td booster every 10 years. ?Zoster vaccine. You may need this after age 20. ?Pneumococcal 13-valent conjugate (PCV13) vaccine. One dose is recommended after age 13. ?Pneumococcal  polysaccharide (PPSV23) vaccine. One dose is recommended after age 94. ?Talk to your health care provider about which screenings and vaccines you need and how often you need them. ?This information is not intended to replace advice given to you by your health care provider. Make sure you discuss any questions you have with your health care provider. ?Document Released: 03/29/2015 Document Revised: 11/20/2015 Document Reviewed: 01/01/2015 ?Elsevier Interactive Patient Education ? 2017 [place]. ? ?Fall Prevention in the Home ?Falls can cause injuries. They can happen to people of all ages. There are many things you can do to make your home safe and to help prevent falls. ?What can I do on the outside of my home? ?Regularly fix the edges of walkways and driveways and fix any cracks. ?Remove anything that might make you trip as you walk through a door, such as a raised step or threshold. ?Trim any bushes or trees on the path to your home. ?Use bright outdoor lighting. ?Clear any walking paths of anything that might make someone trip, such as rocks or tools. ?Regularly check to see if handrails are loose or broken. Make sure that both sides of any steps have handrails. ?Any raised decks and porches should have guardrails on the edges. ?Have any leaves, snow, or ice cleared regularly. ?Use sand or salt on walking paths during winter. ?Clean up any spills in your garage right away. This includes oil or grease spills. ?What can I do in the bathroom? ?Use night lights. ?Install grab bars by the toilet and in the tub and shower. Do not use towel bars as grab bars. ?Use non-skid mats or decals in the tub or shower. ?If you need to sit down in the shower, use a plastic, non-slip stool. ?Keep the floor dry. Clean up any water that spills on the floor as soon as it happens. ?Remove soap buildup in the tub or shower regularly. ?Attach bath mats securely with double-sided non-slip rug tape. ?Do not have throw rugs and other  things on the floor that can make you trip. ?What can I do in the bedroom? ?Use night lights. ?Make sure that you have a light by your bed that is easy to reach. ?Do not use any sheets or blankets that are too big for your bed. They should not hang down onto the floor. ?Have a firm chair that has side arms. You can use this for support while you get dressed. ?Do not have throw rugs and other things on the floor that can make you trip. ?What can I do in the kitchen? ?Clean up any spills right away. ?Avoid walking on wet floors. ?Keep items that you use a lot in easy-to-reach places. ?If you need to reach something above you, use a strong step stool that has a grab bar. ?Keep electrical cords out of the way. ?Do not use floor polish or wax that makes floors slippery. If you must use wax, use non-skid floor wax. ?Do not have throw rugs and other things on the floor that can make you trip. ?What can I do with my stairs? ?Do not leave any items on the stairs. ?Make sure that there are handrails on both sides of the stairs and use them. Fix handrails that are broken or loose. Make sure that handrails are as long as the stairways. ?Check any carpeting to make sure that it is firmly attached to the stairs. Fix any carpet that is loose or worn. ?Avoid having throw rugs at the top or  bottom of the stairs. If you do have throw rugs, attach them to the floor with carpet tape. ?Make sure that you have a light switch at the top of the stairs and the bottom of the stairs. If you do not have them, ask someone to add them for you. ?What else can I do to help prevent falls? ?Wear shoes that: ?Do not have high heels. ?Have rubber bottoms. ?Are comfortable and fit you well. ?Are closed at the toe. Do not wear sandals. ?If you use a stepladder: ?Make sure that it is fully opened. Do not climb a closed stepladder. ?Make sure that both sides of the stepladder are locked into place. ?Ask someone to hold it for you, if possible. ?Clearly  mark and make sure that you can see: ?Any grab bars or handrails. ?First and last steps. ?Where the edge of each step is. ?Use tools that help you move around (mobility aids) if they are needed. These include: ?Can

## 2021-06-19 NOTE — Progress Notes (Signed)
?I connected with [name] today by telephone and verified that I am speaking with the correct person using two identifiers. ?Location patient: home ?Location provider: work ?Persons participating in the virtual visit: Delayna, Updyke LPN. ?  ?I discussed the limitations, risks, security and privacy concerns of performing an evaluation and management service by telephone and the availability of in person appointments. I also discussed with the patient that there may be a patient responsible charge related to this service. The patient expressed understanding and verbally consented to this telephonic visit.  ?  ?Interactive audio and video telecommunications were attempted between this provider and patient, however failed, due to patient having technical difficulties OR patient did not have access to video capability.  We continued and completed visit with audio only. ? ?  ? ?Vital signs may be patient reported or missing. ? ?Subjective:  ? Tara Guerra is a 76 y.o. female who presents for Medicare Annual (Subsequent) preventive examination. ? ?Review of Systems    ? ?Cardiac Risk Factors include: advanced age (>45men, >14 women);dyslipidemia;hypertension;obesity (BMI >30kg/m2) ? ?   ?Objective:  ?  ?Today's Vitals  ? 06/19/21 1027  ?Weight: 207 lb (93.9 kg)  ?Height: 5\' 5"  (1.651 m)  ? ?Body mass index is 34.45 kg/m?. ? ? ?  06/19/2021  ? 10:31 AM 05/29/2020  ?  2:36 PM 05/24/2019  ?  3:51 PM 09/29/2018  ?  3:30 PM 09/26/2018  ?  1:37 PM 03/30/2018  ?  3:09 PM 11/18/2014  ?  1:56 AM  ?Advanced Directives  ?Does Patient Have a Medical Advance Directive? No No No No No Yes No  ?Does patient want to make changes to medical advance directive?      No - Patient declined   ?Would patient like information on creating a medical advance directive?   Yes (MAU/Ambulatory/Procedural Areas - Information given) No - Patient declined No - Patient declined  Yes - Scientist, clinical (histocompatibility and immunogenetics) given;Yes - Spiritual care consult  ordered  ? ? ?Current Medications (verified) ?Outpatient Encounter Medications as of 06/19/2021  ?Medication Sig  ? Menthol, Topical Analgesic, (BENGAY EX) Apply 1 application topically daily as needed (pain).  ? olmesartan-hydrochlorothiazide (BENICAR HCT) 40-25 MG tablet Take 1 tablet by mouth daily.  ? omeprazole (PRILOSEC OTC) 20 MG tablet Take 1 tablet (20 mg total) by mouth daily.  ? rosuvastatin (CRESTOR) 10 MG tablet TAKE 1 TABLET(10 MG) BY MOUTH AT BEDTIME  ? Vitamin D, Ergocalciferol, (DRISDOL) 1.25 MG (50000 UNIT) CAPS capsule Take 1 capsule (50,000 Units total) by mouth every 7 (seven) days. (Patient not taking: Reported on 06/19/2021)  ? ?No facility-administered encounter medications on file as of 06/19/2021.  ? ? ?Allergies (verified) ?Patient has no known allergies.  ? ?History: ?Past Medical History:  ?Diagnosis Date  ? Arthritis   ? hips andhands , Knees  ? Chronic kidney disease   ? Hypercholesteremia   ? Hypertension   ? ?Past Surgical History:  ?Procedure Laterality Date  ? COLONOSCOPY  2019  ? TOTAL HIP ARTHROPLASTY Right 09/29/2018  ? Procedure: TOTAL HIP ARTHROPLASTY ANTERIOR APPROACH;  Surgeon: Rod Can, MD;  Location: [facility];  Service: Orthopedics;  Laterality: Right;  ? VASCULAR SURGERY Right   ? Varicose veins stripping  ? ?Family History  ?Problem Relation Age of Onset  ? Alzheimer's disease Mother   ? Heart attack Father   ? ?Social History  ? ?Socioeconomic History  ? Marital status: Widowed  ?  Spouse name: Not on  file  ? Number of children: Not on file  ? Years of education: Not on file  ? Highest education level: Not on file  ?Occupational History  ? Not on file  ?Tobacco Use  ? Smoking status: Never  ? Smokeless tobacco: Never  ?Vaping Use  ? Vaping Use: Never used  ?Substance and Sexual Activity  ? Alcohol use: No  ? Drug use: No  ? Sexual activity: Not Currently  ?Other Topics Concern  ? Not on file  ?Social History Narrative  ? Not on file  ? ?Social Determinants of Health   ? ?Financial Resource Strain: Low Risk   ? Difficulty of Paying Living Expenses: Not hard at all  ?Food Insecurity: No Food Insecurity  ? Worried About Charity fundraiser in the Last Year: Never true  ? Ran Out of Food in the Last Year: Never true  ?Transportation Needs: No Transportation Needs  ? Lack of Transportation (Medical): No  ? Lack of Transportation (Non-Medical): No  ?Physical Activity: Insufficiently Active  ? Days of Exercise per Week: 3 days  ? Minutes of Exercise per Session: 20 min  ?Stress: No Stress Concern Present  ? Feeling of Stress : Not at all  ?Social Connections: Not on file  ? ? ?Tobacco Counseling ?Counseling given: Not Answered ? ? ?Clinical Intake: ? ?Pre-visit preparation completed: Yes ? ?Pain : No/denies pain ? ?  ? ?Nutritional Status: BMI > 30  Obese ?Nutritional Risks: None ?Diabetes: No ? ?How often do you need to have someone help you when you read instructions, pamphlets, or other written materials from your doctor or pharmacy?: 1 - Never ?What is the last grade level you completed in school?: 9th grade ? ?Diabetic? no ? ?Interpreter Needed?: No ? ?Information entered by :: NAllen LPN ? ? ?Activities of Daily Living ? ?  06/19/2021  ? 10:32 AM  ?In your present state of health, do you have any difficulty performing the following activities:  ?Hearing? 0  ?Vision? 0  ?Difficulty concentrating or making decisions? 0  ?Walking or climbing stairs? 0  ?Dressing or bathing? 0  ?Doing errands, shopping? 0  ?Preparing Food and eating ? N  ?Using the Toilet? N  ?In the past six months, have you accidently leaked urine? N  ?Do you have problems with loss of bowel control? N  ?Managing your Medications? N  ?Managing your Finances? N  ?Housekeeping or managing your Housekeeping? N  ? ? ?Patient Care Team: ?Glendale Chard, MD as PCP - General (Internal Medicine) ? ?Indicate any recent Medical Services you may have received from other than [facility] providers in the past year (date may be  approximate). ? ?   ?Assessment:  ? This is a routine wellness examination for [facility]. ? ?Hearing/Vision screen ?Vision Screening - Comments:: Regular eye exams, Dr. Venetia Maxon ? ?Dietary issues and exercise activities discussed: ?Current Exercise Habits: Home exercise routine, Type of exercise: treadmill, Time (Minutes): 20, Frequency (Times/Week): 3, Weekly Exercise (Minutes/Week): 60 ? ? Goals Addressed   ? ?  ?  ?  ?  ? This Visit's Progress  ?  Patient Stated     ?  06/19/2021, no goals ?  ? ?  ? ?Depression Screen ? ?  06/19/2021  ? 10:32 AM 05/29/2020  ?  2:36 PM 05/24/2019  ?  3:52 PM 12/29/2018  ?  3:50 PM 09/19/2018  ?  2:21 PM 08/17/2018  ?  9:47 AM 05/11/2018  ?  2:41 PM  ?PHQ  2/9 Scores  ?PHQ - 2 Score 0 0 0 0 0 0 1  ?PHQ- 9 Score   0    4  ?  ?Fall Risk ? ?  06/19/2021  ? 10:31 AM 05/29/2020  ?  2:36 PM 05/24/2019  ?  3:52 PM 12/29/2018  ?  3:50 PM 09/19/2018  ?  2:20 PM  ?Fall Risk   ?Falls in the past year? 0 0 0 0 0  ?Risk for fall due to : Medication side effect Medication side effect Medication side effect    ?Follow up Falls evaluation completed;Education provided;Falls prevention discussed Falls evaluation completed;Education provided;Falls prevention discussed Falls evaluation completed;Education provided;Falls prevention discussed    ? ? ?FALL RISK PREVENTION PERTAINING TO THE HOME: ? ?Any stairs in or around the home? No  ?If so, are there any without handrails?  N/a ?Home free of loose throw rugs in walkways, pet beds, electrical cords, etc? Yes  ?Adequate lighting in your home to reduce risk of falls? Yes  ? ?ASSISTIVE DEVICES UTILIZED TO PREVENT FALLS: ? ?Life alert? No  ?Use of a cane, [place] or w/c? No  ?Grab bars in the bathroom? Yes  ?Shower chair or bench in shower? Yes  ?Elevated toilet seat or a handicapped toilet? Yes  ? ?TIMED UP AND GO: ? ?Was the test performed? No .  ?  ? ? ? ?Cognitive Function: ?  ?  ? ?  06/19/2021  ? 10:32 AM 05/29/2020  ?  2:37 PM 05/24/2019  ?  3:54 PM 03/30/2018  ?  3:12 PM   ?6CIT Screen  ?What Year? 0 points 0 points 0 points 0 points  ?What month? 0 points 0 points 0 points 0 points  ?What time? 0 points 0 points 3 points 0 points  ?Count back from 20 0 points 4 points 2 points 0 poin

## 2021-10-07 DIAGNOSIS — M1711 Unilateral primary osteoarthritis, right knee: Secondary | ICD-10-CM | POA: Diagnosis not present

## 2021-10-07 DIAGNOSIS — Z96641 Presence of right artificial hip joint: Secondary | ICD-10-CM | POA: Diagnosis not present

## 2021-10-07 DIAGNOSIS — Z471 Aftercare following joint replacement surgery: Secondary | ICD-10-CM | POA: Diagnosis not present

## 2021-11-18 ENCOUNTER — Other Ambulatory Visit: Payer: Self-pay | Admitting: Family

## 2021-11-18 DIAGNOSIS — Z1231 Encounter for screening mammogram for malignant neoplasm of breast: Secondary | ICD-10-CM

## 2021-12-17 ENCOUNTER — Ambulatory Visit: Payer: Medicare Other

## 2021-12-26 ENCOUNTER — Other Ambulatory Visit: Payer: Self-pay | Admitting: Internal Medicine

## 2021-12-26 DIAGNOSIS — E785 Hyperlipidemia, unspecified: Secondary | ICD-10-CM

## 2022-01-13 ENCOUNTER — Ambulatory Visit: Payer: Medicare Other

## 2022-02-10 ENCOUNTER — Other Ambulatory Visit: Payer: Self-pay | Admitting: Student

## 2022-02-10 DIAGNOSIS — E2839 Other primary ovarian failure: Secondary | ICD-10-CM

## 2022-03-13 ENCOUNTER — Ambulatory Visit: Payer: Medicare Other

## 2022-03-31 ENCOUNTER — Other Ambulatory Visit: Payer: Self-pay | Admitting: Internal Medicine

## 2022-03-31 DIAGNOSIS — E785 Hyperlipidemia, unspecified: Secondary | ICD-10-CM

## 2022-04-02 ENCOUNTER — Other Ambulatory Visit: Payer: Self-pay | Admitting: Internal Medicine

## 2022-04-20 ENCOUNTER — Other Ambulatory Visit: Payer: Self-pay | Admitting: Internal Medicine

## 2022-04-20 DIAGNOSIS — E785 Hyperlipidemia, unspecified: Secondary | ICD-10-CM

## 2022-05-15 ENCOUNTER — Ambulatory Visit
Admission: RE | Admit: 2022-05-15 | Discharge: 2022-05-15 | Disposition: A | Discharge status: Home / Self Care | Payer: Medicare HMO | Source: Ambulatory Visit | Attending: Family | Admitting: Family

## 2022-05-15 DIAGNOSIS — Z1231 Encounter for screening mammogram for malignant neoplasm of breast: Secondary | ICD-10-CM

## 2022-05-20 ENCOUNTER — Other Ambulatory Visit: Payer: Self-pay | Admitting: Family

## 2022-05-20 DIAGNOSIS — R928 Other abnormal and inconclusive findings on diagnostic imaging of breast: Secondary | ICD-10-CM

## 2022-06-02 ENCOUNTER — Other Ambulatory Visit: Payer: Self-pay | Admitting: Student

## 2022-06-02 DIAGNOSIS — R928 Other abnormal and inconclusive findings on diagnostic imaging of breast: Secondary | ICD-10-CM

## 2022-06-11 ENCOUNTER — Ambulatory Visit
Admission: RE | Admit: 2022-06-11 | Discharge: 2022-06-11 | Disposition: A | Discharge status: Home / Self Care | Payer: Medicare HMO | Source: Ambulatory Visit | Attending: Student | Admitting: Student

## 2022-06-11 ENCOUNTER — Other Ambulatory Visit: Payer: Self-pay | Admitting: Student

## 2022-06-11 DIAGNOSIS — R928 Other abnormal and inconclusive findings on diagnostic imaging of breast: Secondary | ICD-10-CM

## 2022-06-11 DIAGNOSIS — R921 Mammographic calcification found on diagnostic imaging of breast: Secondary | ICD-10-CM

## 2022-06-25 ENCOUNTER — Ambulatory Visit
Admission: RE | Admit: 2022-06-25 | Discharge: 2022-06-25 | Disposition: A | Discharge status: Home / Self Care | Payer: Medicare HMO | Source: Ambulatory Visit | Attending: Student | Admitting: Student

## 2022-06-25 DIAGNOSIS — R921 Mammographic calcification found on diagnostic imaging of breast: Secondary | ICD-10-CM

## 2022-06-25 HISTORY — PX: BREAST BIOPSY: SHX20

## 2022-07-02 ENCOUNTER — Ambulatory Visit: Payer: Medicare (Managed Care) | Admitting: Internal Medicine

## 2022-07-16 ENCOUNTER — Other Ambulatory Visit: Payer: Self-pay | Admitting: General Surgery

## 2022-07-16 DIAGNOSIS — N6092 Unspecified benign mammary dysplasia of left breast: Secondary | ICD-10-CM

## 2022-07-24 ENCOUNTER — Other Ambulatory Visit: Payer: Self-pay

## 2022-07-24 ENCOUNTER — Encounter (HOSPITAL_BASED_OUTPATIENT_CLINIC_OR_DEPARTMENT_OTHER): Payer: Self-pay | Admitting: General Surgery

## 2022-07-30 ENCOUNTER — Encounter (HOSPITAL_BASED_OUTPATIENT_CLINIC_OR_DEPARTMENT_OTHER)
Admission: RE | Admit: 2022-07-30 | Discharge: 2022-07-30 | Disposition: A | Discharge status: Home / Self Care | Payer: Medicare HMO | Source: Ambulatory Visit | Attending: General Surgery | Admitting: General Surgery

## 2022-07-30 DIAGNOSIS — Z01818 Encounter for other preprocedural examination: Secondary | ICD-10-CM | POA: Diagnosis present

## 2022-07-30 DIAGNOSIS — I4510 Unspecified right bundle-branch block: Secondary | ICD-10-CM | POA: Insufficient documentation

## 2022-07-30 LAB — BASIC METABOLIC PANEL
Anion gap: 8 (ref 5–15)
BUN: 20 mg/dL (ref 8–23)
CO2: 24 mmol/L (ref 22–32)
Calcium: 9.1 mg/dL (ref 8.9–10.3)
Chloride: 104 mmol/L (ref 98–111)
Creatinine, Ser: 1.3 mg/dL — ABNORMAL HIGH (ref 0.44–1.00)
GFR, Estimated: 43 mL/min — ABNORMAL LOW (ref >60)
Glucose, Bld: 93 mg/dL (ref 70–99)
Potassium: 4.4 mmol/L (ref 3.5–5.1)
Sodium: 136 mmol/L (ref 135–145)

## 2022-07-30 MED ORDER — CHLORHEXIDINE GLUCONATE CLOTH 2 % EX PADS: 6.0000 | MEDICATED_PAD | Freq: Once | CUTANEOUS | Status: DC

## 2022-07-30 MED ORDER — ENSURE PRE-SURGERY PO LIQD: 296.0000 mL | Freq: Once | ORAL | Status: DC

## 2022-07-30 NOTE — Progress Notes (Signed)

## 2022-07-31 ENCOUNTER — Ambulatory Visit
Admission: RE | Admit: 2022-07-31 | Discharge: 2022-07-31 | Disposition: A | Discharge status: Home / Self Care | Payer: Medicare HMO | Source: Ambulatory Visit | Attending: General Surgery | Admitting: General Surgery

## 2022-07-31 DIAGNOSIS — N6092 Unspecified benign mammary dysplasia of left breast: Secondary | ICD-10-CM

## 2022-07-31 HISTORY — PX: BREAST BIOPSY: SHX20

## 2022-08-03 ENCOUNTER — Ambulatory Visit (HOSPITAL_BASED_OUTPATIENT_CLINIC_OR_DEPARTMENT_OTHER)
Admission: RE | Admit: 2022-08-03 | Discharge: 2022-08-03 | Disposition: A | Discharge status: Home / Self Care | Payer: Medicare HMO | Attending: General Surgery | Admitting: General Surgery

## 2022-08-03 ENCOUNTER — Other Ambulatory Visit: Payer: Self-pay

## 2022-08-03 ENCOUNTER — Ambulatory Visit (HOSPITAL_BASED_OUTPATIENT_CLINIC_OR_DEPARTMENT_OTHER): Payer: Medicare HMO | Admitting: Anesthesiology

## 2022-08-03 ENCOUNTER — Encounter (HOSPITAL_BASED_OUTPATIENT_CLINIC_OR_DEPARTMENT_OTHER): Payer: Self-pay | Admitting: General Surgery

## 2022-08-03 ENCOUNTER — Other Ambulatory Visit: Payer: Self-pay | Admitting: General Surgery

## 2022-08-03 ENCOUNTER — Encounter (HOSPITAL_BASED_OUTPATIENT_CLINIC_OR_DEPARTMENT_OTHER)
Admission: RE | Disposition: A | Discharge status: Home / Self Care | Payer: Self-pay | Source: Home / Self Care | Attending: General Surgery

## 2022-08-03 ENCOUNTER — Ambulatory Visit
Admission: RE | Admit: 2022-08-03 | Discharge: 2022-08-03 | Disposition: A | Discharge status: Home / Self Care | Payer: Medicare HMO | Source: Ambulatory Visit | Attending: General Surgery | Admitting: General Surgery

## 2022-08-03 DIAGNOSIS — N6042 Mammary duct ectasia of left breast: Secondary | ICD-10-CM | POA: Diagnosis not present

## 2022-08-03 DIAGNOSIS — E669 Obesity, unspecified: Secondary | ICD-10-CM

## 2022-08-03 DIAGNOSIS — I10 Essential (primary) hypertension: Secondary | ICD-10-CM

## 2022-08-03 DIAGNOSIS — N6092 Unspecified benign mammary dysplasia of left breast: Secondary | ICD-10-CM

## 2022-08-03 DIAGNOSIS — N6012 Diffuse cystic mastopathy of left breast: Secondary | ICD-10-CM | POA: Diagnosis not present

## 2022-08-03 DIAGNOSIS — R921 Mammographic calcification found on diagnostic imaging of breast: Secondary | ICD-10-CM | POA: Diagnosis not present

## 2022-08-03 DIAGNOSIS — D242 Benign neoplasm of left breast: Secondary | ICD-10-CM

## 2022-08-03 DIAGNOSIS — N6022 Fibroadenosis of left breast: Secondary | ICD-10-CM | POA: Insufficient documentation

## 2022-08-03 DIAGNOSIS — Z01818 Encounter for other preprocedural examination: Secondary | ICD-10-CM

## 2022-08-03 DIAGNOSIS — N6082 Other benign mammary dysplasias of left breast: Secondary | ICD-10-CM | POA: Insufficient documentation

## 2022-08-03 DIAGNOSIS — Z6833 Body mass index (BMI) 33.0-33.9, adult: Secondary | ICD-10-CM | POA: Diagnosis not present

## 2022-08-03 HISTORY — PX: RADIOACTIVE SEED GUIDED EXCISIONAL BREAST BIOPSY: SHX6490

## 2022-08-03 SURGERY — RADIOACTIVE SEED GUIDED BREAST BIOPSY
Anesthesia: General | Site: Breast | Laterality: Left

## 2022-08-03 MED ORDER — LACTATED RINGERS IV SOLN: INTRAVENOUS | Status: DC

## 2022-08-03 MED ORDER — CEFAZOLIN SODIUM-DEXTROSE 2-4 GM/100ML-% IV SOLN
INTRAVENOUS | Status: AC
  Filled 2022-08-03: qty 100

## 2022-08-03 MED ORDER — OXYCODONE HCL 5 MG/5ML PO SOLN: 5.0000 mg | Freq: Once | ORAL | Status: AC | PRN

## 2022-08-03 MED ORDER — HYDROMORPHONE HCL 1 MG/ML IJ SOLN
INTRAMUSCULAR | Status: AC
  Filled 2022-08-03: qty 0.5

## 2022-08-03 MED ORDER — AMISULPRIDE (ANTIEMETIC) 5 MG/2ML IV SOLN: 10.0000 mg | Freq: Once | INTRAVENOUS | Status: DC | PRN

## 2022-08-03 MED ORDER — BUPIVACAINE HCL (PF) 0.25 % IJ SOLN
INTRAMUSCULAR | Status: DC | PRN
  Administered 2022-08-03: 9 mL

## 2022-08-03 MED ORDER — PROPOFOL 10 MG/ML IV BOLUS
INTRAVENOUS | Status: DC | PRN
  Administered 2022-08-03: 150 mg via INTRAVENOUS
  Administered 2022-08-03: 50 mg via INTRAVENOUS

## 2022-08-03 MED ORDER — LIDOCAINE 2% (20 MG/ML) 5 ML SYRINGE
INTRAMUSCULAR | Status: AC
  Filled 2022-08-03: qty 5

## 2022-08-03 MED ORDER — OXYCODONE HCL 5 MG PO TABS
5.0000 mg | ORAL_TABLET | Freq: Once | ORAL | Status: AC | PRN
  Administered 2022-08-03: 5 mg via ORAL

## 2022-08-03 MED ORDER — FENTANYL CITRATE (PF) 100 MCG/2ML IJ SOLN
INTRAMUSCULAR | Status: AC
  Filled 2022-08-03: qty 2

## 2022-08-03 MED ORDER — MIDAZOLAM HCL 5 MG/5ML IJ SOLN
INTRAMUSCULAR | Status: DC | PRN
  Administered 2022-08-03: 2 mg via INTRAVENOUS

## 2022-08-03 MED ORDER — PHENYLEPHRINE HCL (PRESSORS) 10 MG/ML IV SOLN
INTRAVENOUS | Status: DC | PRN
  Administered 2022-08-03 (×2): 80 ug via INTRAVENOUS

## 2022-08-03 MED ORDER — PROPOFOL 10 MG/ML IV BOLUS
INTRAVENOUS | Status: AC
  Filled 2022-08-03: qty 20

## 2022-08-03 MED ORDER — OXYCODONE HCL 5 MG PO TABS
ORAL_TABLET | ORAL | Status: AC
  Filled 2022-08-03: qty 1

## 2022-08-03 MED ORDER — DEXAMETHASONE SODIUM PHOSPHATE 10 MG/ML IJ SOLN
INTRAMUSCULAR | Status: AC
  Filled 2022-08-03: qty 1

## 2022-08-03 MED ORDER — MIDAZOLAM HCL 2 MG/2ML IJ SOLN
INTRAMUSCULAR | Status: AC
  Filled 2022-08-03: qty 2

## 2022-08-03 MED ORDER — CEFAZOLIN SODIUM-DEXTROSE 2-4 GM/100ML-% IV SOLN
2.0000 g | INTRAVENOUS | Status: AC
  Administered 2022-08-03: 2 g via INTRAVENOUS

## 2022-08-03 MED ORDER — ACETAMINOPHEN 500 MG PO TABS
1000.0000 mg | ORAL_TABLET | ORAL | Status: AC
  Administered 2022-08-03: 1000 mg via ORAL

## 2022-08-03 MED ORDER — PROPOFOL 500 MG/50ML IV EMUL
INTRAVENOUS | Status: DC | PRN
  Administered 2022-08-03: 150 ug/kg/min via INTRAVENOUS

## 2022-08-03 MED ORDER — ONDANSETRON HCL 4 MG/2ML IJ SOLN: 4.0000 mg | Freq: Once | INTRAMUSCULAR | Status: DC | PRN

## 2022-08-03 MED ORDER — ACETAMINOPHEN 500 MG PO TABS: 1000.0000 mg | ORAL_TABLET | Freq: Once | ORAL | Status: DC

## 2022-08-03 MED ORDER — ACETAMINOPHEN 500 MG PO TABS
ORAL_TABLET | ORAL | Status: AC
  Filled 2022-08-03: qty 2

## 2022-08-03 MED ORDER — DEXAMETHASONE SODIUM PHOSPHATE 10 MG/ML IJ SOLN
INTRAMUSCULAR | Status: DC | PRN
  Administered 2022-08-03: 10 mg via INTRAVENOUS

## 2022-08-03 MED ORDER — ONDANSETRON HCL 4 MG/2ML IJ SOLN
INTRAMUSCULAR | Status: AC
  Filled 2022-08-03: qty 2

## 2022-08-03 MED ORDER — FENTANYL CITRATE (PF) 100 MCG/2ML IJ SOLN
INTRAMUSCULAR | Status: DC | PRN
  Administered 2022-08-03: 50 ug via INTRAVENOUS
  Administered 2022-08-03: 25 ug via INTRAVENOUS

## 2022-08-03 MED ORDER — HYDROMORPHONE HCL 1 MG/ML IJ SOLN
0.2500 mg | INTRAMUSCULAR | Status: DC | PRN
  Administered 2022-08-03: 0.25 mg via INTRAVENOUS

## 2022-08-03 MED ORDER — ONDANSETRON HCL 4 MG/2ML IJ SOLN
INTRAMUSCULAR | Status: DC | PRN
  Administered 2022-08-03: 4 mg via INTRAVENOUS

## 2022-08-03 MED ORDER — LIDOCAINE HCL 1 % IJ SOLN
INTRAMUSCULAR | Status: DC | PRN
  Administered 2022-08-03: 60 mg via INTRADERMAL

## 2022-08-03 SURGICAL SUPPLY — 52 items
ADH SKN CLS APL DERMABOND .7 (GAUZE/BANDAGES/DRESSINGS) ×1
APL PRP STRL LF DISP 70% ISPRP (MISCELLANEOUS) ×1
APPLIER CLIP 9.375 MED OPEN (MISCELLANEOUS)
APR CLP MED 9.3 20 MLT OPN (MISCELLANEOUS)
BINDER BREAST XLRG (GAUZE/BANDAGES/DRESSINGS) IMPLANT
BINDER BREAST XXLRG (GAUZE/BANDAGES/DRESSINGS) IMPLANT
BLADE SURG 15 STRL LF DISP TIS (BLADE) ×1 IMPLANT
BLADE SURG 15 STRL SS (BLADE) ×1
CANISTER SUC SOCK COL 7IN (MISCELLANEOUS) IMPLANT
CANISTER SUCT 1200ML W/VALVE (MISCELLANEOUS) IMPLANT
CHLORAPREP W/TINT 26 (MISCELLANEOUS) ×1 IMPLANT
CLIP APPLIE 9.375 MED OPEN (MISCELLANEOUS) IMPLANT
CLIP TI WIDE RED SMALL 6 (CLIP) IMPLANT
COVER BACK TABLE 60X90IN (DRAPES) ×1 IMPLANT
COVER MAYO STAND STRL (DRAPES) ×1 IMPLANT
COVER PROBE CYLINDRICAL 5X96 (MISCELLANEOUS) ×1 IMPLANT
DERMABOND ADVANCED .7 DNX12 (GAUZE/BANDAGES/DRESSINGS) ×1 IMPLANT
DRAPE LAPAROSCOPIC ABDOMINAL (DRAPES) ×1 IMPLANT
DRAPE UTILITY XL STRL (DRAPES) ×1 IMPLANT
ELECT COATED BLADE 2.86 ST (ELECTRODE) ×1 IMPLANT
ELECT REM PT RETURN 9FT ADLT (ELECTROSURGICAL) ×1
ELECTRODE REM PT RTRN 9FT ADLT (ELECTROSURGICAL) ×1 IMPLANT
GAUZE SPONGE 4X4 12PLY STRL LF (GAUZE/BANDAGES/DRESSINGS) IMPLANT
GLOVE BIO SURGEON STRL SZ 6.5 (GLOVE) IMPLANT
GLOVE BIO SURGEON STRL SZ7 (GLOVE) ×2 IMPLANT
GLOVE BIOGEL PI IND STRL 7.5 (GLOVE) ×1 IMPLANT
GOWN STRL REUS W/ TWL LRG LVL3 (GOWN DISPOSABLE) ×2 IMPLANT
GOWN STRL REUS W/TWL LRG LVL3 (GOWN DISPOSABLE) ×2
HEMOSTAT ARISTA ABSORB 3G PWDR (HEMOSTASIS) IMPLANT
KIT MARKER MARGIN INK (KITS) ×1 IMPLANT
NDL HYPO 25X1 1.5 SAFETY (NEEDLE) ×1 IMPLANT
NEEDLE HYPO 25X1 1.5 SAFETY (NEEDLE) ×1 IMPLANT
NS IRRIG 1000ML POUR BTL (IV SOLUTION) IMPLANT
PACK BASIN DAY SURGERY FS (CUSTOM PROCEDURE TRAY) ×1 IMPLANT
PENCIL SMOKE EVACUATOR (MISCELLANEOUS) ×1 IMPLANT
RETRACTOR ONETRAX LX 90X20 (MISCELLANEOUS) IMPLANT
SLEEVE SCD COMPRESS KNEE MED (STOCKING) ×1 IMPLANT
SPIKE FLUID TRANSFER (MISCELLANEOUS) IMPLANT
SPONGE T-LAP 4X18 ~~LOC~~+RFID (SPONGE) ×1 IMPLANT
STRIP CLOSURE SKIN 1/2X4 (GAUZE/BANDAGES/DRESSINGS) ×1 IMPLANT
SUT MNCRL AB 4-0 PS2 18 (SUTURE) IMPLANT
SUT MON AB 5-0 PS2 18 (SUTURE) IMPLANT
SUT SILK 2 0 SH (SUTURE) IMPLANT
SUT VIC AB 2-0 SH 27 (SUTURE) ×1
SUT VIC AB 2-0 SH 27XBRD (SUTURE) ×1 IMPLANT
SUT VIC AB 3-0 SH 27 (SUTURE) ×1
SUT VIC AB 3-0 SH 27X BRD (SUTURE) ×1 IMPLANT
SYR CONTROL 10ML LL (SYRINGE) ×1 IMPLANT
TOWEL GREEN STERILE FF (TOWEL DISPOSABLE) ×1 IMPLANT
TRAY FAXITRON CT DISP (TRAY / TRAY PROCEDURE) ×1 IMPLANT
TUBE CONNECTING 20X1/4 (TUBING) IMPLANT
YANKAUER SUCT BULB TIP NO VENT (SUCTIONS) IMPLANT

## 2022-08-03 NOTE — Interval H&P Note (Signed)
History and Physical Interval Note:  08/03/2022 9:27 AM  Tara Guerra  has presented today for surgery, with the diagnosis of LEFT BREAST CALCIFICATIONS.  The various methods of treatment have been discussed with the patient and family. After consideration of risks, benefits and other options for treatment, the patient has consented to  Procedure(s): RADIOACTIVE SEED GUIDED EXCISIONAL LEFT BREAST BIOPSY (Left) as a surgical intervention.  The patient's history has been reviewed, patient examined, no change in status, stable for surgery.  I have reviewed the patient's chart and labs.  Questions were answered to the patient's satisfaction.     Emelia Loron

## 2022-08-03 NOTE — Discharge Instructions (Addendum)
[facility] Office Phone Number [phone]  POST OP INSTRUCTIONS Take 400 mg of ibuprofen every 8 hours or 650 mg tylenol every 6 hours for next 72 hours then as needed. Use ice several times daily also.  A prescription for pain medication may be given to you upon discharge.  Take your pain medication as prescribed, if needed.  If narcotic pain medicine is not needed, then you may take acetaminophen (Tylenol), naprosyn (Alleve) or ibuprofen (Advil) as needed. Take your usually prescribed medications unless otherwise directed If you need a refill on your pain medication, please contact your pharmacy.  They will contact our office to request authorization.  Prescriptions will not be filled after 5pm or on week-ends. You should eat very light the first 24 hours after surgery, such as soup, crackers, pudding, etc.  Resume your normal diet the day after surgery. Most patients will experience some swelling and bruising in the breast.  Ice packs and a good support bra will help.  Wear the breast binder provided or a sports bra for 72 hours day and night.  After that wear a sports bra during the day until you return to the office. Swelling and bruising can take several days to resolve.  It is common to experience some constipation if taking pain medication after surgery.  Increasing fluid intake and taking a stool softener will usually help or prevent this problem from occurring.  A mild laxative (Milk of Magnesia or Miralax) should be taken according to package directions if there are no bowel movements after 48 hours. I used skin glue on the incision, you may shower in 24 hours.  The glue will flake off over the next 2-3 weeks.  Any sutures or staples will be removed at the office during your follow-up visit. ACTIVITIES:  You may resume regular daily activities (gradually increasing) beginning the next day.  Wearing a good support bra or sports bra minimizes pain and swelling.  You may have  sexual intercourse when it is comfortable. You may drive when you no longer are taking prescription pain medication, you can comfortably wear a seatbelt, and you can safely maneuver your car and apply brakes. RETURN TO WORK:  ______________________________________________________________________________________ Bonita Quin should see your doctor in the office for a follow-up appointment approximately two weeks after your surgery.  Your doctor's nurse will typically make your follow-up appointment when she calls you with your pathology report.  Expect your pathology report 3-4 business days after your surgery.  You may call to check if you do not hear from [place] after three days. OTHER INSTRUCTIONS: _______________________________________________________________________________________________ _____________________________________________________________________________________________________________________________________ _____________________________________________________________________________________________________________________________________ _____________________________________________________________________________________________________________________________________  WHEN TO CALL DR WAKEFIELD: Fever over 101.0 Nausea and/or vomiting. Extreme swelling or bruising. Continued bleeding from incision. Increased pain, redness, or drainage from the incision.  The clinic staff is available to answer your questions during regular business hours.  Please don't hesitate to call and ask to speak to one of the nurses for clinical concerns.  If you have a medical emergency, go to the nearest emergency room or call 911.  A surgeon from [facility] is always on call at the hospital.  For further questions, please visit [email or web address]  Next dose of Tylenol can be taken at 3pm today if needed.

## 2022-08-03 NOTE — Anesthesia Postprocedure Evaluation (Signed)
Anesthesia Post Note  Patient: Aishia Devico  Procedure(s) Performed: RADIOACTIVE SEED GUIDED EXCISIONAL LEFT BREAST BIOPSY (Left: Breast)     Patient location during evaluation: PACU Anesthesia Type: General Level of consciousness: awake and alert, oriented and patient cooperative Pain management: pain level controlled Vital Signs Assessment: post-procedure vital signs reviewed and stable Respiratory status: spontaneous breathing, nonlabored ventilation and respiratory function stable Cardiovascular status: blood pressure returned to baseline and stable Postop Assessment: no apparent nausea or vomiting Anesthetic complications: no   No notable events documented.  Last Vitals:  Vitals:   08/03/22 0902 08/03/22 1030  BP: (!) 140/75 113/64  Pulse: 82 71  Resp: 16 15  Temp: 36.9 C   SpO2: 97% 97%    Last Pain:  Vitals:   08/03/22 1040  TempSrc:   PainSc: 5                  Lannie Fields

## 2022-08-03 NOTE — Anesthesia Procedure Notes (Signed)
Procedure Name: LMA Insertion Date/Time: 08/03/2022 9:55 AM  Performed by: Thornell Mule, CRNAPre-anesthesia Checklist: Patient identified, Emergency Drugs available, Suction available and Patient being monitored Patient Re-evaluated:Patient Re-evaluated prior to induction Oxygen Delivery Method: Circle system utilized Preoxygenation: Pre-oxygenation with 100% oxygen Induction Type: IV induction LMA: LMA inserted LMA Size: 4.0 Number of attempts: 1 Placement Confirmation: positive ETCO2 Tube secured with: Tape Dental Injury: Teeth and Oropharynx as per pre-operative assessment

## 2022-08-03 NOTE — Transfer of Care (Signed)
Immediate Anesthesia Transfer of Care Note  Patient: Anabia Dovalina  Procedure(s) Performed: RADIOACTIVE SEED GUIDED EXCISIONAL LEFT BREAST BIOPSY (Left: Breast)  Patient Location: PACU  Anesthesia Type:General  Level of Consciousness: drowsy and patient cooperative  Airway & Oxygen Therapy: Patient Spontanous Breathing and Patient connected to face mask oxygen  Post-op Assessment: Report given to RN and Post -op Vital signs reviewed and stable  Post vital signs: Reviewed and stable  Last Vitals:  Vitals Value Taken Time  BP    Temp    Pulse 71 08/03/22 1029  Resp 15 08/03/22 1029  SpO2 97 % 08/03/22 1029  Vitals shown include unvalidated device data.  Last Pain:  Vitals:   08/03/22 0902  TempSrc: Oral  PainSc: 0-No pain         Complications: No notable events documented.

## 2022-08-03 NOTE — H&P (Signed)
  46 yof with no prior breast history, no family history underwent screening mm. She has B density breast tissue. She was noted to have 7 mm of calcifications in the retroareolar right breast. Biopsy was done that shows papilloma with ADH. She is here to discuss options.  Review of Systems: A complete review of systems was obtained from the patient. I have reviewed this information and discussed as appropriate with the patient. See HPI as well for other ROS.  Review of Systems Respiratory: Positive for cough. Cardiovascular: Positive for leg swelling. Gastrointestinal: Positive for constipation and heartburn. Musculoskeletal: Positive for neck pain. All other systems reviewed and are negative.   Medical History: Past Medical History: Diagnosis Date Arthritis Glaucoma (increased eye pressure) Hyperlipidemia Hypertension  There is no problem list on file for this patient.  Past Surgical History: Procedure Laterality Date JOINT REPLACEMENT  No Known Allergies  Current Outpatient Medications on File Prior to Visit Medication Sig Dispense Refill olmesartan-amLODIPine-hydroCHLOROthiazide (TRIBENZOR) 40-5-25 mg tablet Take 1 tablet by mouth once daily olmesartan-hydroCHLOROthiazide (BENICAR HCT) 40-25 mg tablet Take 1 tablet by mouth once daily rosuvastatin (CRESTOR) 10 MG tablet Take 10 mg by mouth at bedtime   Family History Problem Relation Age of Onset High blood pressure (Hypertension) Mother Hyperlipidemia (Elevated cholesterol) Mother High blood pressure (Hypertension) Father Hyperlipidemia (Elevated cholesterol) Father High blood pressure (Hypertension) Sister Hyperlipidemia (Elevated cholesterol) Sister Diabetes Sister High blood pressure (Hypertension) Brother Hyperlipidemia (Elevated cholesterol) Brother   Social History  Tobacco Use Smoking Status Never Smokeless Tobacco Never Marital status: Widowed Tobacco Use Smoking status: Never Smokeless tobacco:  Never Substance and Sexual Activity Alcohol use: Not Currently Drug use: Never   Objective:  Vitals: 07/16/22 1433 BP: (!) 166/89 Pulse: 88 Temp: 36.1 C (97 F) SpO2: 97% Weight: 95.3 kg (210 lb) Height: 167.6 cm (5\' 6" )  Body mass index is 33.89 kg/m.  Physical Exam Vitals reviewed. Constitutional: Appearance: Normal appearance. Chest: Breasts: Right: No inverted nipple, mass or nipple discharge. Left: No inverted nipple, mass or nipple discharge. Lymphadenopathy: Upper Body: Right upper body: No supraclavicular or axillary adenopathy. Left upper body: No supraclavicular or axillary adenopathy. Neurological: Mental Status: She is alert.   Atypical ductal hyperplasia of left breast  Left breast radioactive seed guided excisional biopsy  We discussed recommendation for a seed guided excisional biopsy given atypical ductal hyperplasia rate we discussed a possible 10 to 15% upgrade rate. Observation is not the best option for her. We discussed surgery, risk, recovery. Will plan to schedule her.

## 2022-08-03 NOTE — Anesthesia Preprocedure Evaluation (Addendum)
Anesthesia Evaluation  Patient identified by MRN, date of birth, ID band Patient awake    Reviewed: Allergy & Precautions, NPO status , Patient's Chart, lab work & pertinent test results  Airway Mallampati: IV  TM Distance: >3 FB Neck ROM: Full    Dental  (+) Edentulous Upper   Pulmonary neg pulmonary ROS   Pulmonary exam normal breath sounds clear to auscultation       Cardiovascular hypertension (140/75 preop, per pt normally 140s SBP), Pt. on medications Normal cardiovascular exam Rhythm:Regular Rate:Normal  Hasn't taken BP meds in 3days   Neuro/Psych negative neurological ROS  negative psych ROS   GI/Hepatic Neg liver ROS,GERD  Medicated and Controlled,,  Endo/Other  negative endocrine ROS    Renal/GU Renal InsufficiencyRenal diseaseCr 1.3  negative genitourinary   Musculoskeletal  (+) Arthritis , Osteoarthritis,    Abdominal  (+) + obese  Peds  Hematology negative hematology ROS (+)   Anesthesia Other Findings   Reproductive/Obstetrics negative OB ROS                              Anesthesia Physical Anesthesia Plan  ASA: 3  Anesthesia Plan: General   Post-op Pain Management: Tylenol PO (pre-op)*   Induction: Intravenous  PONV Risk Score and Plan: 3 and Ondansetron, Dexamethasone, Treatment may vary due to age or medical condition, Propofol infusion and TIVA  Airway Management Planned: LMA  Additional Equipment: None  Intra-op Plan:   Post-operative Plan: Extubation in OR  Informed Consent: I have reviewed the patients History and Physical, chart, labs and discussed the procedure including the risks, benefits and alternatives for the proposed anesthesia with the patient or authorized representative who has indicated his/her understanding and acceptance.     Dental advisory given  Plan Discussed with: CRNA  Anesthesia Plan Comments:         Anesthesia Quick  Evaluation

## 2022-08-03 NOTE — Op Note (Signed)
Preoperative diagnosis: Left breast calcifications with core biopsy c/w ADH Postoperative diagnosis: Same as above Procedure: Left breast radioactive seed guided excisional biopsy Surgeon: Dr. Harden Mo Anesthesia: General Estimated blood loss: Minimal Complications: None Drains: None Specimens: Left breast tissue containing seed and clip marked with paint sent to pathology Sponge needle count was correct at completion Disposition to recovery stable condition   Indications: 55 yof with no prior breast history, no family history underwent screening mm. She has B density breast tissue. She was noted to have 7 mm of calcifications in the retroareolar right breast. Biopsy was done that shows papilloma with ADH. She is here to discuss options    Procedure: After informed consent was obtained she was taken to the operating room.  She was given antibiotics.  SCDs were placed.  She was placed under general anesthesia without complication.  She was prepped and draped in the standard sterile surgical fashion.  A surgical timeout was then performed.   The seed was in the lateral breast.  I made a periareolar incision in order to hide the scar later.  I infiltrated Marcaine throughout this area.  I then used the neoprobe to dissect to the seed.   I removed the seed and some of the surrounding tissue.  Mammogram confirmed removal of the seed and the clip.  I then obtained hemostasis.  I closed the breast tissue with 2-0 Vicryl.  The skin was closed with 3-0 Vicryl and 5-0 Monocryl.  Glue and Steri-Strips were applied.  She tolerated well was extubated transferred to recovery stable.

## 2022-08-04 ENCOUNTER — Encounter (HOSPITAL_BASED_OUTPATIENT_CLINIC_OR_DEPARTMENT_OTHER): Payer: Self-pay | Admitting: General Surgery

## 2022-08-04 LAB — SURGICAL PATHOLOGY

## 2022-11-11 IMAGING — MG MM DIGITAL SCREENING BILAT W/ TOMO AND CAD
8 series · 8 of 24 positions shown · non-contrast
Comparison: Previous exam(s).

CLINICAL DATA: Screening.

EXAM:
DIGITAL SCREENING BILATERAL MAMMOGRAM WITH TOMOSYNTHESIS AND CAD
TECHNIQUE: Bilateral screening digital craniocaudal and mediolateral oblique
mammograms were obtained. Bilateral screening digital breast
tomosynthesis was performed. The images were evaluated with
computer-aided detection.

[R CC synth-2D]
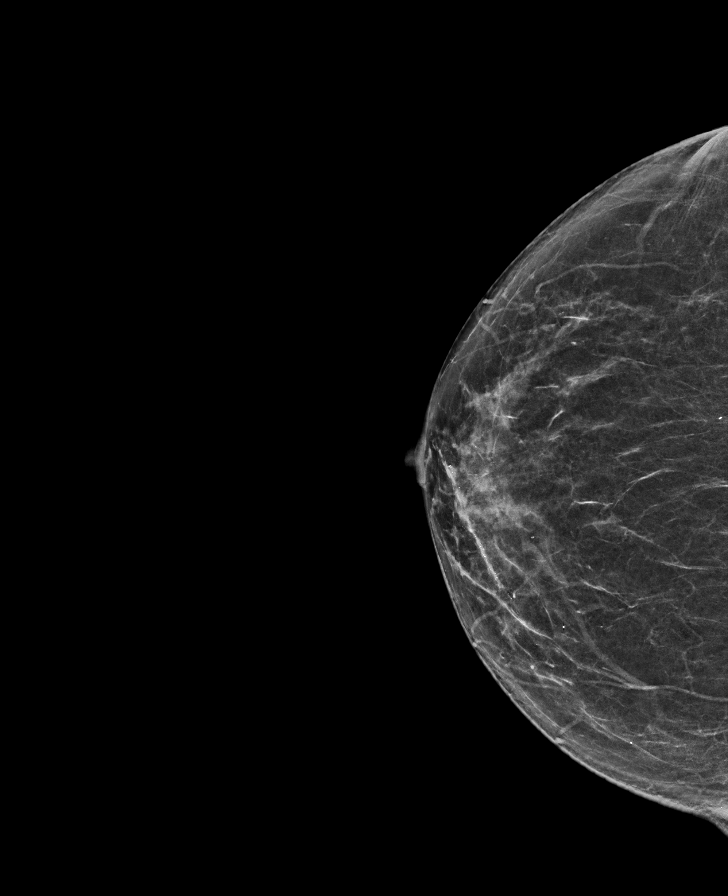

[L CC synth-2D]
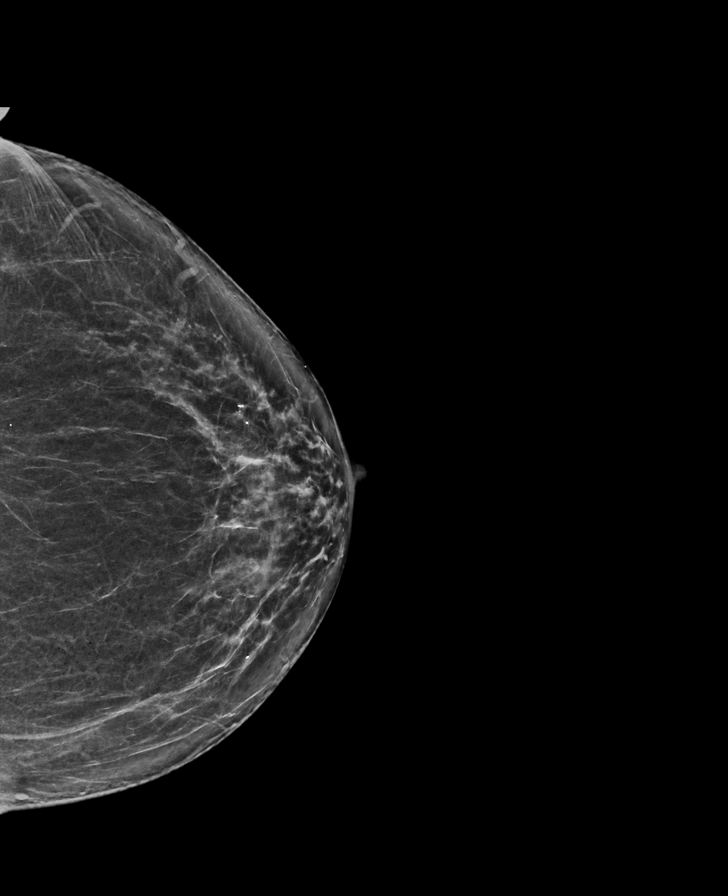

[R MLO synth-2D]
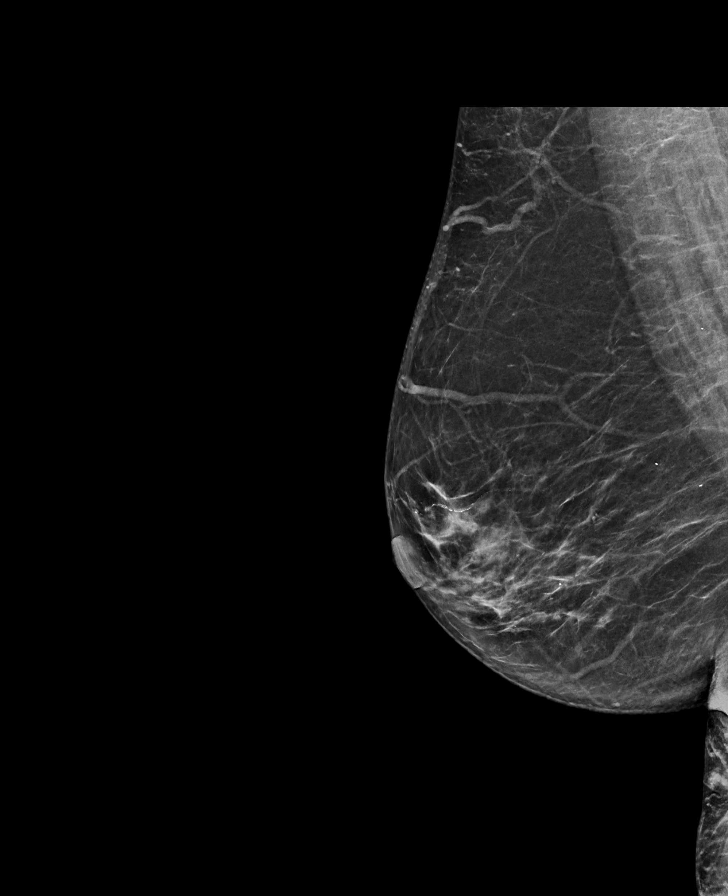

[L MLO synth-2D]
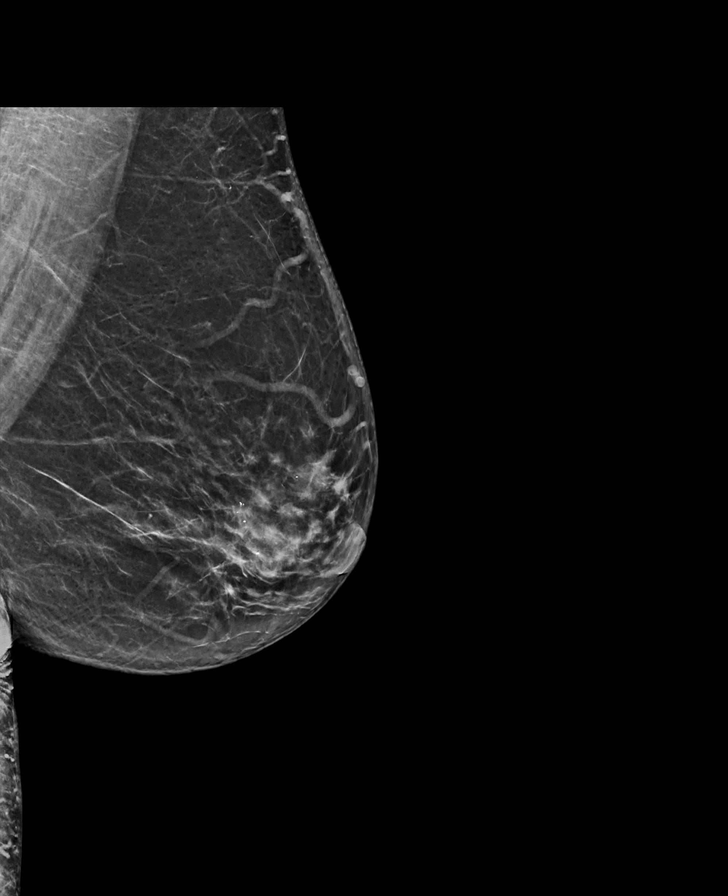

[R CC tomo · tomo slice 34/67.0]
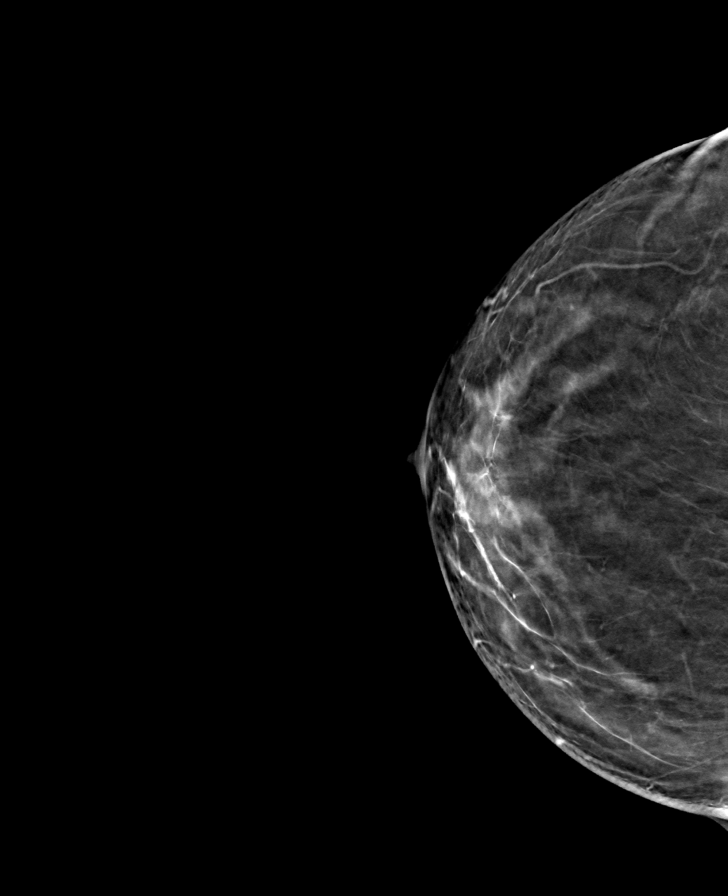

[L CC tomo · tomo slice 35/68.0]
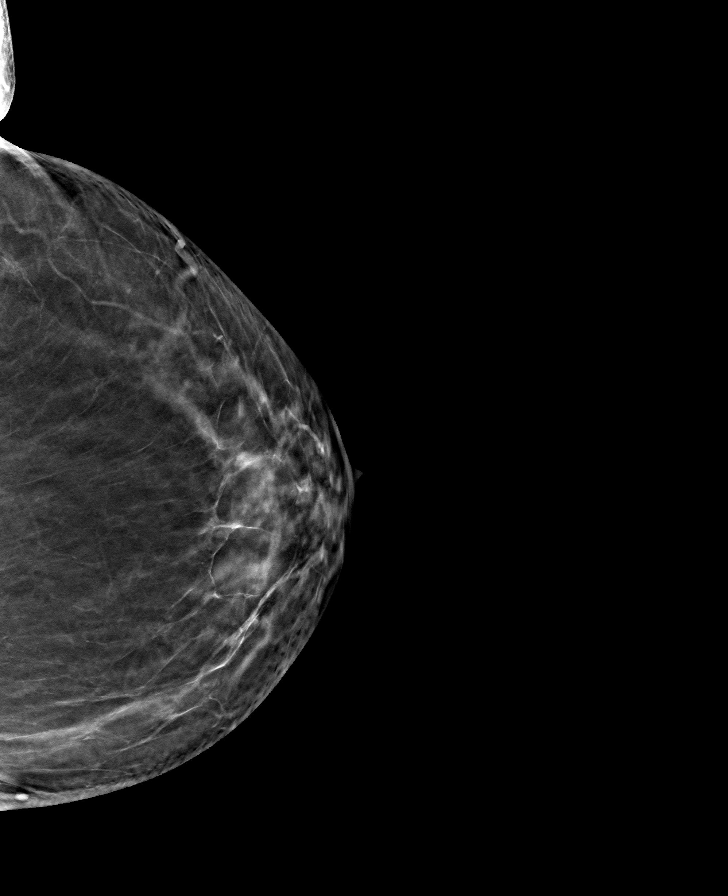

[R MLO tomo · tomo slice 36/71.0]
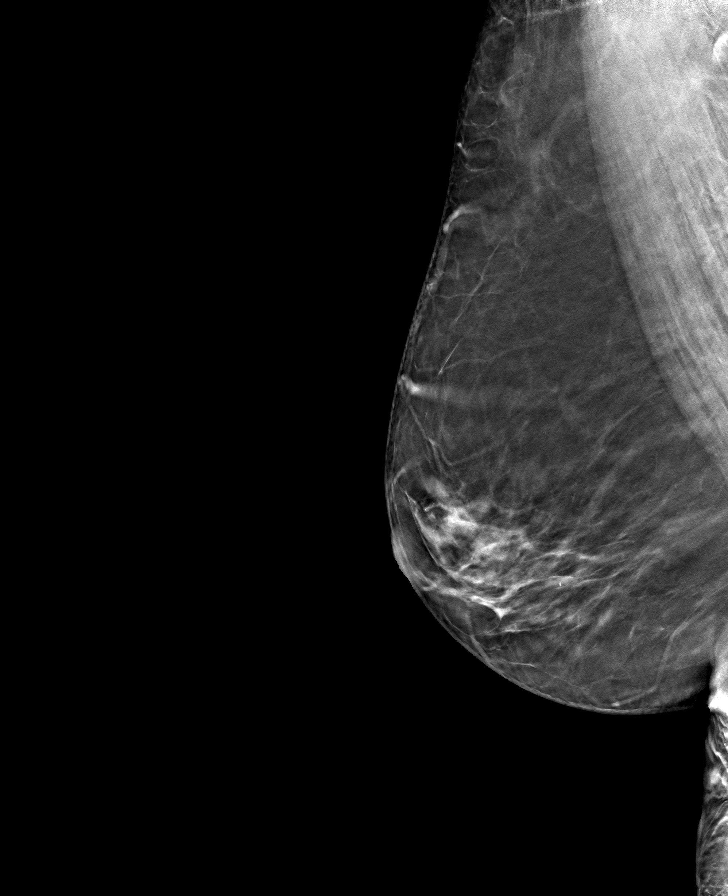

[L MLO tomo · tomo slice 38/75.0]
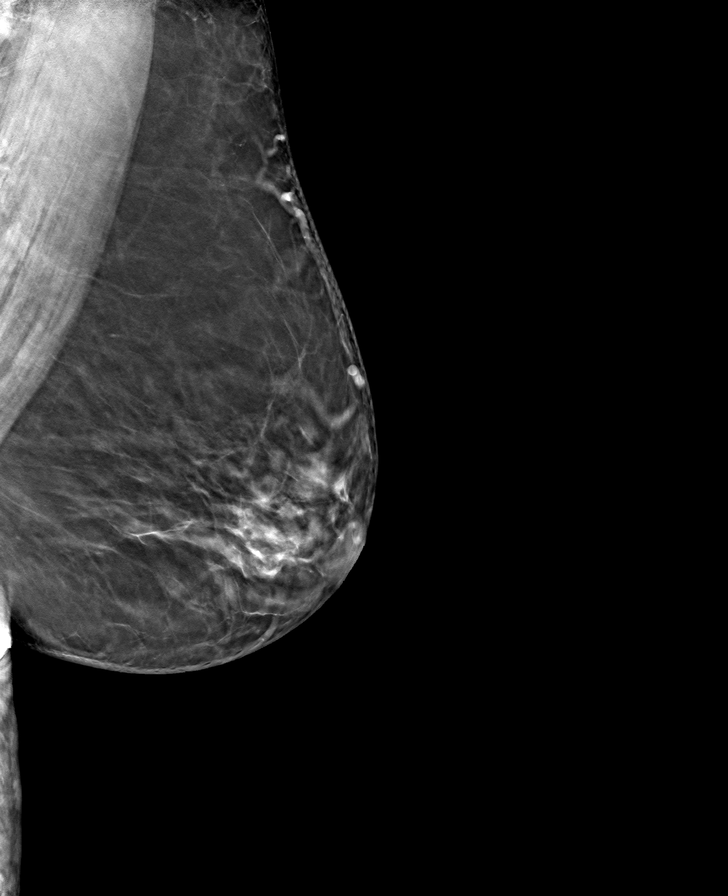

[8 of 24 positions shown; findings below may reference images not displayed]

ACR Breast Density Category b: There are scattered areas of
fibroglandular density.
FINDINGS: There are no findings suspicious for malignancy.
IMPRESSION: No mammographic evidence of malignancy. A result letter of this
screening mammogram will be mailed directly to the patient.

RECOMMENDATION:
Screening mammogram in one year. (Code:51-O-LD2)

BI-RADS CATEGORY  1: Negative.

## 2022-11-20 ENCOUNTER — Ambulatory Visit: Payer: Medicaid Other | Admitting: Internal Medicine

## 2023-08-16 ENCOUNTER — Other Ambulatory Visit: Payer: Self-pay | Admitting: Student

## 2023-08-16 DIAGNOSIS — Z1231 Encounter for screening mammogram for malignant neoplasm of breast: Secondary | ICD-10-CM

## 2023-08-24 ENCOUNTER — Ambulatory Visit

## 2023-08-24 ENCOUNTER — Ambulatory Visit
Admission: RE | Admit: 2023-08-24 | Discharge: 2023-08-24 | Disposition: A | Discharge status: Home / Self Care | Source: Ambulatory Visit | Attending: Student

## 2023-08-24 DIAGNOSIS — Z1231 Encounter for screening mammogram for malignant neoplasm of breast: Secondary | ICD-10-CM

## 2023-08-27 ENCOUNTER — Other Ambulatory Visit: Payer: Self-pay | Admitting: Student

## 2023-08-27 DIAGNOSIS — R928 Other abnormal and inconclusive findings on diagnostic imaging of breast: Secondary | ICD-10-CM

## 2023-09-01 ENCOUNTER — Ambulatory Visit
Admission: RE | Admit: 2023-09-01 | Discharge: 2023-09-01 | Disposition: A | Discharge status: Home / Self Care | Source: Ambulatory Visit | Attending: Student | Admitting: Student

## 2023-09-01 ENCOUNTER — Other Ambulatory Visit: Payer: Self-pay | Admitting: Student

## 2023-09-01 DIAGNOSIS — R928 Other abnormal and inconclusive findings on diagnostic imaging of breast: Secondary | ICD-10-CM

## 2023-09-01 DIAGNOSIS — R921 Mammographic calcification found on diagnostic imaging of breast: Secondary | ICD-10-CM

## 2024-03-03 ENCOUNTER — Inpatient Hospital Stay: Admission: RE | Admit: 2024-03-03 | Discharge: 2024-03-03 | Attending: Student

## 2024-03-03 DIAGNOSIS — R921 Mammographic calcification found on diagnostic imaging of breast: Secondary | ICD-10-CM
